# Patient Record
Sex: Male | Born: 1935 | Race: White | Hispanic: No | Smoking: Smoker, current status unknown
Health system: Southern US, Community
[De-identification: ages and names within clinical notes are randomized; demographics above are authoritative.]

## PROBLEM LIST (undated history)

## (undated) DIAGNOSIS — E785 Hyperlipidemia, unspecified: Secondary | ICD-10-CM

## (undated) DIAGNOSIS — I6523 Occlusion and stenosis of bilateral carotid arteries: Secondary | ICD-10-CM

## (undated) DIAGNOSIS — R569 Unspecified convulsions: Secondary | ICD-10-CM

## (undated) DIAGNOSIS — I639 Cerebral infarction, unspecified: Secondary | ICD-10-CM

## (undated) DIAGNOSIS — R1310 Dysphagia, unspecified: Secondary | ICD-10-CM

## (undated) DIAGNOSIS — N390 Urinary tract infection, site not specified: Secondary | ICD-10-CM

## (undated) DIAGNOSIS — I10 Essential (primary) hypertension: Secondary | ICD-10-CM

---

## 2017-11-24 ENCOUNTER — Emergency Department (HOSPITAL_COMMUNITY): Payer: Medicare Other

## 2017-11-24 ENCOUNTER — Encounter (HOSPITAL_COMMUNITY): Payer: Self-pay | Admitting: Emergency Medicine

## 2017-11-24 ENCOUNTER — Inpatient Hospital Stay (HOSPITAL_COMMUNITY)
Admission: EM | Admit: 2017-11-24 | Discharge: 2017-11-28 | DRG: 101 | Disposition: A | Discharge status: Skilled Nursing Facility | Payer: Medicare Other | Attending: Student in an Organized Health Care Education/Training Program | Admitting: Student in an Organized Health Care Education/Training Program

## 2017-11-24 ENCOUNTER — Inpatient Hospital Stay (HOSPITAL_COMMUNITY): Payer: Medicare Other

## 2017-11-24 DIAGNOSIS — R4701 Aphasia: Secondary | ICD-10-CM | POA: Diagnosis present

## 2017-11-24 DIAGNOSIS — G9340 Encephalopathy, unspecified: Secondary | ICD-10-CM | POA: Diagnosis present

## 2017-11-24 DIAGNOSIS — E785 Hyperlipidemia, unspecified: Secondary | ICD-10-CM

## 2017-11-24 DIAGNOSIS — Z79899 Other long term (current) drug therapy: Secondary | ICD-10-CM | POA: Diagnosis not present

## 2017-11-24 DIAGNOSIS — G40109 Localization-related (focal) (partial) symptomatic epilepsy and epileptic syndromes with simple partial seizures, not intractable, without status epilepticus: Secondary | ICD-10-CM | POA: Diagnosis not present

## 2017-11-24 DIAGNOSIS — Z87891 Personal history of nicotine dependence: Secondary | ICD-10-CM | POA: Diagnosis not present

## 2017-11-24 DIAGNOSIS — Z66 Do not resuscitate: Secondary | ICD-10-CM | POA: Diagnosis present

## 2017-11-24 DIAGNOSIS — I10 Essential (primary) hypertension: Secondary | ICD-10-CM | POA: Diagnosis present

## 2017-11-24 DIAGNOSIS — Z7982 Long term (current) use of aspirin: Secondary | ICD-10-CM | POA: Diagnosis not present

## 2017-11-24 DIAGNOSIS — R29727 NIHSS score 27: Secondary | ICD-10-CM | POA: Diagnosis present

## 2017-11-24 DIAGNOSIS — G40909 Epilepsy, unspecified, not intractable, without status epilepticus: Principal | ICD-10-CM | POA: Diagnosis present

## 2017-11-24 DIAGNOSIS — Z8673 Personal history of transient ischemic attack (TIA), and cerebral infarction without residual deficits: Secondary | ICD-10-CM | POA: Diagnosis not present

## 2017-11-24 DIAGNOSIS — R569 Unspecified convulsions: Secondary | ICD-10-CM

## 2017-11-24 DIAGNOSIS — G9349 Other encephalopathy: Secondary | ICD-10-CM | POA: Diagnosis not present

## 2017-11-24 HISTORY — DX: Essential (primary) hypertension: I10

## 2017-11-24 HISTORY — DX: Cerebral infarction, unspecified: I63.9

## 2017-11-24 LAB — CBC
HEMATOCRIT: 39.4 % (ref 39.0–52.0)
HEMOGLOBIN: 13.3 g/dL (ref 13.0–17.0)
MCH: 28.8 pg (ref 26.0–34.0)
MCHC: 33.8 g/dL (ref 30.0–36.0)
MCV: 85.3 fL (ref 78.0–100.0)
Platelets: 150 10*3/uL (ref 150–400)
RBC: 4.62 MIL/uL (ref 4.22–5.81)
RDW: 13.4 % (ref 11.5–15.5)
WBC: 10.5 10*3/uL (ref 4.0–10.5)

## 2017-11-24 LAB — I-STAT CHEM 8, ED
BUN: 18 mg/dL (ref 6–20)
CHLORIDE: 101 mmol/L (ref 101–111)
Calcium, Ion: 1.02 mmol/L — ABNORMAL LOW (ref 1.15–1.40)
Creatinine, Ser: 1.3 mg/dL — ABNORMAL HIGH (ref 0.61–1.24)
GLUCOSE: 149 mg/dL — AB (ref 65–99)
HEMATOCRIT: 41 % (ref 39.0–52.0)
Hemoglobin: 13.9 g/dL (ref 13.0–17.0)
POTASSIUM: 4 mmol/L (ref 3.5–5.1)
Sodium: 138 mmol/L (ref 135–145)
TCO2: 24 mmol/L (ref 22–32)

## 2017-11-24 LAB — COMPREHENSIVE METABOLIC PANEL
ALBUMIN: 4.2 g/dL (ref 3.5–5.0)
ALK PHOS: 86 U/L (ref 38–126)
ALT: 17 U/L (ref 17–63)
ANION GAP: 14 (ref 5–15)
AST: 37 U/L (ref 15–41)
BILIRUBIN TOTAL: 1.2 mg/dL (ref 0.3–1.2)
BUN: 16 mg/dL (ref 6–20)
CALCIUM: 9 mg/dL (ref 8.9–10.3)
CO2: 21 mmol/L — ABNORMAL LOW (ref 22–32)
Chloride: 99 mmol/L — ABNORMAL LOW (ref 101–111)
Creatinine, Ser: 1.44 mg/dL — ABNORMAL HIGH (ref 0.61–1.24)
GFR calc Af Amer: 50 mL/min — ABNORMAL LOW (ref >60)
GFR, EST NON AFRICAN AMERICAN: 43 mL/min — AB (ref >60)
GLUCOSE: 147 mg/dL — AB (ref 65–99)
POTASSIUM: 4 mmol/L (ref 3.5–5.1)
Sodium: 134 mmol/L — ABNORMAL LOW (ref 135–145)
TOTAL PROTEIN: 8.4 g/dL — AB (ref 6.5–8.1)

## 2017-11-24 LAB — RAPID URINE DRUG SCREEN, HOSP PERFORMED
AMPHETAMINES: NOT DETECTED
BARBITURATES: NOT DETECTED
Benzodiazepines: NOT DETECTED
Cocaine: NOT DETECTED
Opiates: NOT DETECTED
TETRAHYDROCANNABINOL: NOT DETECTED

## 2017-11-24 LAB — URINALYSIS, ROUTINE W REFLEX MICROSCOPIC
BILIRUBIN URINE: NEGATIVE
Bacteria, UA: NONE SEEN
GLUCOSE, UA: NEGATIVE mg/dL
Ketones, ur: NEGATIVE mg/dL
LEUKOCYTES UA: NEGATIVE
NITRITE: NEGATIVE
PH: 6 (ref 5.0–8.0)
Protein, ur: >=300 mg/dL — AB
SPECIFIC GRAVITY, URINE: 1.045 — AB (ref 1.005–1.030)
Squamous Epithelial / LPF: NONE SEEN

## 2017-11-24 LAB — LIPID PANEL
Cholesterol: 252 mg/dL — ABNORMAL HIGH (ref 0–200)
HDL: 51 mg/dL (ref >40)
LDL CALC: 183 mg/dL — AB (ref 0–99)
Total CHOL/HDL Ratio: 4.9 RATIO
Triglycerides: 89 mg/dL (ref <150)
VLDL: 18 mg/dL (ref 0–40)

## 2017-11-24 LAB — DIFFERENTIAL
Basophils Absolute: 0 10*3/uL (ref 0.0–0.1)
Basophils Relative: 0 %
EOS ABS: 0 10*3/uL (ref 0.0–0.7)
Eosinophils Relative: 0 %
LYMPHS ABS: 0.5 10*3/uL — AB (ref 0.7–4.0)
LYMPHS PCT: 5 %
MONOS PCT: 8 %
Monocytes Absolute: 0.8 10*3/uL (ref 0.1–1.0)
NEUTROS PCT: 87 %
Neutro Abs: 9.2 10*3/uL — ABNORMAL HIGH (ref 1.7–7.7)

## 2017-11-24 LAB — I-STAT CG4 LACTIC ACID, ED: LACTIC ACID, VENOUS: 2.18 mmol/L — AB (ref 0.5–1.9)

## 2017-11-24 LAB — ETHANOL: Alcohol, Ethyl (B): <10 mg/dL (ref <10)

## 2017-11-24 LAB — I-STAT TROPONIN, ED: TROPONIN I, POC: 0.34 ng/mL — AB (ref 0.00–0.08)

## 2017-11-24 LAB — TROPONIN I: Troponin I: 0.31 ng/mL (ref <0.03)

## 2017-11-24 LAB — PROTIME-INR
INR: 1.28
PROTHROMBIN TIME: 15.9 s — AB (ref 11.4–15.2)

## 2017-11-24 LAB — CBG MONITORING, ED: Glucose-Capillary: 163 mg/dL — ABNORMAL HIGH (ref 65–99)

## 2017-11-24 LAB — APTT: aPTT: 28 seconds (ref 24–36)

## 2017-11-24 MED ORDER — LEVETIRACETAM IN NACL 1500 MG/100ML IV SOLN
1500.0000 mg | Freq: Once | INTRAVENOUS | Status: AC
  Administered 2017-11-24: 1500 mg via INTRAVENOUS
  Filled 2017-11-24: qty 100

## 2017-11-24 MED ORDER — SODIUM CHLORIDE 0.9 % IV SOLN
1000.0000 mg | Freq: Once | INTRAVENOUS | Status: DC
  Filled 2017-11-24: qty 20

## 2017-11-24 MED ORDER — LORAZEPAM 2 MG/ML IJ SOLN
1.0000 mg | INTRAMUSCULAR | Status: DC | PRN
  Administered 2017-11-24: 1 mg via INTRAVENOUS
  Filled 2017-11-24: qty 1

## 2017-11-24 MED ORDER — HEPARIN SODIUM (PORCINE) 5000 UNIT/ML IJ SOLN
5000.0000 [IU] | Freq: Three times a day (TID) | INTRAMUSCULAR | Status: DC
  Administered 2017-11-24 – 2017-11-28 (×13): 5000 [IU] via SUBCUTANEOUS
  Filled 2017-11-24 (×13): qty 1

## 2017-11-24 MED ORDER — SODIUM CHLORIDE 0.9 % IV SOLN
750.0000 mg | Freq: Two times a day (BID) | INTRAVENOUS | Status: DC
  Administered 2017-11-24 – 2017-11-28 (×8): 750 mg via INTRAVENOUS
  Filled 2017-11-24 (×8): qty 7.5

## 2017-11-24 MED ORDER — IOPAMIDOL (ISOVUE-370) INJECTION 76%
90.0000 mL | Freq: Once | INTRAVENOUS | Status: AC | PRN
  Administered 2017-11-24: 90 mL via INTRAVENOUS

## 2017-11-24 MED ORDER — ASPIRIN EC 81 MG PO TBEC
81.0000 mg | DELAYED_RELEASE_TABLET | Freq: Every day | ORAL | Status: DC
  Administered 2017-11-25 – 2017-11-28 (×4): 81 mg via ORAL
  Filled 2017-11-24 (×4): qty 1

## 2017-11-24 NOTE — Procedures (Signed)
EEG Report  Clinical History:  Prior history of seizures.  Presents with right hemiparesis.  MRI Brain negative for acute infarct.    Technical Summary:  A 19 channel digital EEG recording was performed using the 10-20 international system of electrode placement.  Bipolar and Referential montages were used.  The total recording time was approx 20 minutes.  Findings:  There is a posterior dominant rhythm of 8 Hz reactive to eye opening and closure on the right.  Posterior frequencies are in the 6-7 Hz on the left.  There is left frontal and temporal theta frequency slowing throughout the recording.  Sleep is not recorded.  There are no epileptiform discharges or electrographic seizures present.  Impression:  This is an abnormal EEG.  There is evidence of left frontal and temporal dysfunction, which may be due to a post-ictal phenomenon after a seizure.  The patient is not in focal status epilepticus and no current evidence of seizure tendency.    Weston SettleShervin Chaney Ingram, MS, MD

## 2017-11-24 NOTE — Progress Notes (Signed)
Received from ER via stretcher; seizure precautions in place; oriented patient and daughter to room and unit routine; patient is confused but attempting to answer questions; fall safety measures in place; bed alarm is on.

## 2017-11-24 NOTE — Consult Note (Addendum)
NEUROHOSPITALISTS - CONSULTATION NOTE   Requesting Physician: Dr. Doug SouSam Jacubowitz [facility]: Dr. Georgiana SpinnerSushanth Aroor  Admit date: 11/24/2017    Chief Complaint: Aphasia and Right sided weakness - Code Stroke  History obtained from:  Patient's daughter by phone, EMS and Chart  review   HPI   Seth Madden is an 84 y.o. male with a PMH of CVA in 07/2016, HLD and Hx of Seizure not on AED's who is brought into the ED by EMS for Aphasia, Right sided weakness and incontinence noted by family this morning.   History is obtained by phone call with daughter, Seth ReaperSusan Robertson. Patient was found on the couch by son this AM with Right sided weakness and Aphasia. +incontinence. Daughter states this is a similar presentation to 10/2016 when patient was taken to [facility] with suspected CVA but was diagnosed and discharged as Seizure with Keppra BID. Per daughter, at some point the patients PCP discontinued the Keppra and did not think the patient suffered from seizures but had suffered another stroke. Since that time he has not been taking any AED and has had no Neurology follow up.  At baseline the patient lives alone, Independent with ADL's, drives, uses no assistive devices to ambulate Per daughter, no recent injury, illnesses or travel. No N/V/D. No Hx of H/A, No recent weight loss/gain  Date last known well: Date: 11/23/2017 Time last known well: Time: 18:00 tPA Given: No: Outside the window  Modified Rankin: Rankin Score=1 NIH Stroke Scale: 27  Past Medical History   HLD Past Medical History:  Diagnosis Date  . Stroke [facility](HCC)   Seizure - Rx Keppra for Seizures in 10/2016 at [facility], PCP discontinued per daughter Poor short term memory since CVA 07/2016 Past Surgical History  History reviewed. No pertinent surgical history.  Family History  No family history on file.  Social History   has  no tobacco, alcohol, and drug history on file. Lives alone Independent with ADL's at baseline, uses no assistive devices to ambulate Drives, pays bills  Allergies  Not on File  Medications Prior to Admission   No current facility-administered medications on file prior to encounter.    No current outpatient medications on file prior to encounter.   ASA 81 mg daily  ROS  History obtained from unobtainable from patient due to Aphasia Per daughter, no recent injury, illnesses or travel. No N/V/D. No Hx of H/A, No recent weight loss/gain Patient does not appear to be in pain Physical Examination   Vitals:   11/24/17 0941 11/24/17 0945 11/24/17 1000 11/24/17 1015  BP:  (!) 152/81 (!) 143/89 (!) 145/86  Pulse:  84 81 83  Resp:  (!) 24 18 (!) 23  Temp: 97.8 F (36.6 C)     TempSrc: Oral     SpO2:  98% 98% 97%   HEENT-  Normocephalic,  Cardiovascular - Regular rate and rhythm  Respiratory - Non-labored breathing, No wheezing. Abdomen - soft and non-tender, Extremities- no edema or cyanosis Skin-warm and dry  Neurological Examination  Mental Status: Awake and alert, Aphasic, Right sided neglect present. Does not follow any direct commands/mimic. Cranial Nerves: II: Pupils are equal, round, and reactive to light.  +Left gaze preference.  III,IV, VI: Appears unable to cross midline.  +tracking. No ptosis or nystagmus noted.   V: unable to test due to aphasia VII: Facial movement - ? Slight facial droop on Right. ? Baseline finding VIII: hearing appears intact to voice X: unable to  test due to aphasia XI: Shoulder shrug appears weak on right. XII: unable to test due to aphasia Motor: Tone is normal. Bulk is normal.  Does not move any extremities to command. Withdrawals briskly to pain on left. Weak on right. Sensor: Withdrawals briskly to pain on left. Weak on right. Cerebellar: not cooperative for testing due to aphasia Gait: not tested. No obvious truncal  ataxia  Laboratory Results  CBC: Recent Labs  Lab 11/24/17 0850 11/24/17 0855  WBC 10.5  --   HGB 13.3 13.9  HCT 39.4 41.0  MCV 85.3  --   PLT 150  --    Basic Metabolic Panel: Recent Labs  Lab 11/24/17 0850 11/24/17 0855  NA 134* 138  K 4.0 4.0  CL 99* 101  CO2 21*  --   GLUCOSE 147* 149*  BUN 16 18  CREATININE 1.44* 1.30*  CALCIUM 9.0  --    Liver Function Tests: No results for input(s): LIPASE, AMYLASE in the last 168 hours. No results for input(s): AMMONIA in the last 168 hours. Thyroid Function Studies: No results for input(s): TSH, T4TOTAL, T3FREE, THYROIDAB in the last 72 hours. Cardiac Enzymes: No results for input(s): CKTOTAL, CKMB, CKMBINDEX, TROPONINI in the last 168 hours. Coagulation Studies: Recent Labs    11/24/17 0920  APTT 28  INR 1.28   No results for input(s): DDIMER in the last 72 hours. [place] Work -Up No results for input(s): VITAMINB12, FOLATE, IRON, RETICCTPCT in the last 72 hours. Microbiology: @MICRORSLT2 @ Urinalysis: No results for input(s): COLORURINE, APPEARANCEUR, LABSPEC, PHURINE, GLUCOSEU, HGBUR, BILIRUBINUR, KETONESUR, PROTEINUR, UROBILINOGEN, NITRITE, LEUKOCYTESUR in the last 168 hours. Urine Drug Screen:  No results found for: LABOPIA, COCAINSCRNUR, LABBENZ, AMPHETMU, THCU, LABBARB  Alcohol Level:  Recent Labs  Lab 11/24/17 0850  ETH <10    Imaging Results  Ct Angio Head W Or Wo Contrast  Result Date: 11/24/2017 CLINICAL DATA:  Focal neuro deficit.  Stroke suspected. EXAM: CT ANGIOGRAPHY HEAD AND NECK CT PERFUSION BRAIN TECHNIQUE: Multidetector CT imaging of the head and neck was performed using the standard protocol during bolus administration of intravenous contrast. Multiplanar CT image reconstructions and MIPs were obtained to evaluate the vascular anatomy. Carotid stenosis measurements (when applicable) are obtained utilizing NASCET criteria, using the distal internal carotid diameter as the denominator. Multiphase CT  imaging of the brain was performed following IV bolus contrast injection. Subsequent parametric perfusion maps were calculated using RAPID software. CONTRAST:  90mL ISOVUE-370 IOPAMIDOL (ISOVUE-370) INJECTION 76% COMPARISON:  Noncontrast head CT from earlier today FINDINGS: CTA NECK FINDINGS Aortic arch: Atherosclerotic plaque diffusely. Three vessel branching pattern. No acute finding. Right carotid system: Calcified and noncalcified atheromatous wall thickening of the common carotid and proximal ICA. No flow limiting stenosis or ulceration. Left carotid system: Atheromatous wall thickening of the common carotid and proximal ICA. No flow limiting stenosis but there is mild plaque irregularity with small atheromatous ridge at the ICA bulb. Negative for dissection. Vertebral arteries: Mild atheromatous narrowing of the proximal left subclavian artery measuring 40-50%. Right dominant vertebral artery. Vertebral tortuosity without flow limiting stenosis or beading. Skeleton: No acute or aggressive finding. Other neck: No acute or aggressive finding. Upper chest: Mild centrilobular emphysema. Review of the MIP images confirms the above findings CTA HEAD FINDINGS Anterior circulation: Early branching left MCA. Atheromatous plaque on the carotid siphons. No major branch occlusion or flow limiting stenosis. Posterior circulation: Right dominant vertebral artery. Much of the left vertebral flow is into the patent left PICA. The  right AICA is dominant. Vertebral, basilar, and posterior cerebral arteries are smooth and diffusely patent. Venous sinuses: Patent Anatomic variants: None significant Delayed phase: Not obtained in the emergent setting. Possible NPH as noted on preceding head CT. Review of the MIP images confirms the above findings CT Brain Perfusion Findings: CBF (<30%) Volume: 0mL Perfusion (Tmax>6.0s) volume: 0mL These results were communicated to Dr. Laurence Slate at 9:19 amon 3/31/2019by text page via the [facility]  messaging system. IMPRESSION: 1. No emergent large vessel occlusion. No infarct or penumbra by CT perfusion. 2. Cervical atherosclerosis with mild plaque irregularity at the left ICA bulb. 3. 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 4. Mild intracranial atherosclerosis without flow limiting stenosis. 5. Aortic Atherosclerosis (ICD10-I70.0) and Emphysema (ICD10-J43.9). 6. Possible NPH. Electronically Signed   By: Marnee Spring M.D.   On: 11/24/2017 09:22   Ct Angio Neck W Or Wo Contrast  Result Date: 11/24/2017 CLINICAL DATA:  Focal neuro deficit.  Stroke suspected. EXAM: CT ANGIOGRAPHY HEAD AND NECK CT PERFUSION BRAIN TECHNIQUE: Multidetector CT imaging of the head and neck was performed using the standard protocol during bolus administration of intravenous contrast. Multiplanar CT image reconstructions and MIPs were obtained to evaluate the vascular anatomy. Carotid stenosis measurements (when applicable) are obtained utilizing NASCET criteria, using the distal internal carotid diameter as the denominator. Multiphase CT imaging of the brain was performed following IV bolus contrast injection. Subsequent parametric perfusion maps were calculated using RAPID software. CONTRAST:  90mL ISOVUE-370 IOPAMIDOL (ISOVUE-370) INJECTION 76% COMPARISON:  Noncontrast head CT from earlier today FINDINGS: CTA NECK FINDINGS Aortic arch: Atherosclerotic plaque diffusely. Three vessel branching pattern. No acute finding. Right carotid system: Calcified and noncalcified atheromatous wall thickening of the common carotid and proximal ICA. No flow limiting stenosis or ulceration. Left carotid system: Atheromatous wall thickening of the common carotid and proximal ICA. No flow limiting stenosis but there is mild plaque irregularity with small atheromatous ridge at the ICA bulb. Negative for dissection. Vertebral arteries: Mild atheromatous narrowing of the proximal left subclavian artery measuring 40-50%. Right  dominant vertebral artery. Vertebral tortuosity without flow limiting stenosis or beading. Skeleton: No acute or aggressive finding. Other neck: No acute or aggressive finding. Upper chest: Mild centrilobular emphysema. Review of the MIP images confirms the above findings CTA HEAD FINDINGS Anterior circulation: Early branching left MCA. Atheromatous plaque on the carotid siphons. No major branch occlusion or flow limiting stenosis. Posterior circulation: Right dominant vertebral artery. Much of the left vertebral flow is into the patent left PICA. The right AICA is dominant. Vertebral, basilar, and posterior cerebral arteries are smooth and diffusely patent. Venous sinuses: Patent Anatomic variants: None significant Delayed phase: Not obtained in the emergent setting. Possible NPH as noted on preceding head CT. Review of the MIP images confirms the above findings CT Brain Perfusion Findings: CBF (<30%) Volume: 0mL Perfusion (Tmax>6.0s) volume: 0mL These results were communicated to Dr. Laurence Slate at 9:19 amon 3/31/2019by text page via the [facility] messaging system. IMPRESSION: 1. No emergent large vessel occlusion. No infarct or penumbra by CT perfusion. 2. Cervical atherosclerosis with mild plaque irregularity at the left ICA bulb. 3. 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 4. Mild intracranial atherosclerosis without flow limiting stenosis. 5. Aortic Atherosclerosis (ICD10-I70.0) and Emphysema (ICD10-J43.9). 6. Possible NPH. Electronically Signed   By: Marnee Spring M.D.   On: 11/24/2017 09:22   Ct Cerebral Perfusion W Contrast  Result Date: 11/24/2017 CLINICAL DATA:  Focal neuro deficit.  Stroke suspected. EXAM: CT ANGIOGRAPHY HEAD AND NECK  CT PERFUSION BRAIN TECHNIQUE: Multidetector CT imaging of the head and neck was performed using the standard protocol during bolus administration of intravenous contrast. Multiplanar CT image reconstructions and MIPs were obtained to evaluate the vascular anatomy.  Carotid stenosis measurements (when applicable) are obtained utilizing NASCET criteria, using the distal internal carotid diameter as the denominator. Multiphase CT imaging of the brain was performed following IV bolus contrast injection. Subsequent parametric perfusion maps were calculated using RAPID software. CONTRAST:  90mL ISOVUE-370 IOPAMIDOL (ISOVUE-370) INJECTION 76% COMPARISON:  Noncontrast head CT from earlier today FINDINGS: CTA NECK FINDINGS Aortic arch: Atherosclerotic plaque diffusely. Three vessel branching pattern. No acute finding. Right carotid system: Calcified and noncalcified atheromatous wall thickening of the common carotid and proximal ICA. No flow limiting stenosis or ulceration. Left carotid system: Atheromatous wall thickening of the common carotid and proximal ICA. No flow limiting stenosis but there is mild plaque irregularity with small atheromatous ridge at the ICA bulb. Negative for dissection. Vertebral arteries: Mild atheromatous narrowing of the proximal left subclavian artery measuring 40-50%. Right dominant vertebral artery. Vertebral tortuosity without flow limiting stenosis or beading. Skeleton: No acute or aggressive finding. Other neck: No acute or aggressive finding. Upper chest: Mild centrilobular emphysema. Review of the MIP images confirms the above findings CTA HEAD FINDINGS Anterior circulation: Early branching left MCA. Atheromatous plaque on the carotid siphons. No major branch occlusion or flow limiting stenosis. Posterior circulation: Right dominant vertebral artery. Much of the left vertebral flow is into the patent left PICA. The right AICA is dominant. Vertebral, basilar, and posterior cerebral arteries are smooth and diffusely patent. Venous sinuses: Patent Anatomic variants: None significant Delayed phase: Not obtained in the emergent setting. Possible NPH as noted on preceding head CT. Review of the MIP images confirms the above findings CT Brain Perfusion  Findings: CBF (<30%) Volume: 0mL Perfusion (Tmax>6.0s) volume: 0mL These results were communicated to Dr. Laurence Slate at 9:19 amon 3/31/2019by text page via the [facility] messaging system. IMPRESSION: 1. No emergent large vessel occlusion. No infarct or penumbra by CT perfusion. 2. Cervical atherosclerosis with mild plaque irregularity at the left ICA bulb. 3. 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 4. Mild intracranial atherosclerosis without flow limiting stenosis. 5. Aortic Atherosclerosis (ICD10-I70.0) and Emphysema (ICD10-J43.9). 6. Possible NPH. Electronically Signed   By: Marnee Spring M.D.   On: 11/24/2017 09:22   Ct Head Code Stroke Wo Contrast  Result Date: 11/24/2017 CLINICAL DATA:  Code stroke.  Aphasia and right-sided weakness. EXAM: CT HEAD WITHOUT CONTRAST TECHNIQUE: Contiguous axial images were obtained from the base of the skull through the vertex without intravenous contrast. COMPARISON:  None. FINDINGS: Brain: No evidence of acute infarction, hemorrhage, obstructive hydrocephalus, extra-axial collection or mass lesion/mass effect. Ventriculomegaly with variable sulcal diameter (crowding at the vertex) and narrow callosal angle on the coronal reformats. Scattered subarachnoid calcifications attributed to old inflammatory process. No superimposed edema. Atrophy and chronic small vessel ischemia in the cerebral white matter. Vascular: No hyperdense vessel.  Atherosclerotic calcification. Skull: No acute finding. Sinuses/Orbits: Gaze to the left. Other: These results were communicated to Dr. Laurence Slate at 9:03 amon 3/31/2019by text page via the [facility] messaging system. ASPECTS [facility] Stroke Program Early CT Score) - Ganglionic level infarction (caudate, lentiform nuclei, internal capsule, insula, M1-M3 cortex): 7 - Supraganglionic infarction (M4-M6 cortex): 3 Total score (0-10 with 10 being normal): 10 IMPRESSION: 1. No acute finding.ASPECTS is 10 2. Possible NPH. 3. Atrophy and chronic small  vessel ischemia. Electronically Signed   By: Marja Kays  Watts M.D.   On: 11/24/2017 09:05    IMPRESSION AND PLAN  Seth Madden is a 84 y.o. male with PMH of CVA in 07/2016, HLD and Hx of Seizure not on AED's who is brought into the ED by EMS for Aphasia, Right sided weakness and incontinence noted by family this morning. All imaging thus far negative for acute findings   Likely Seizure Activity not on AED's  Outstanding Stroke Work-up Studies:     EEG:                                                                      PENDING  PLAN  11/24/2017  SEIZURES:  Load with Keppra 1.5 Gm IV now Continue Keppra 750 mg BID Switch to PO Keppra once more alert Ativan PRN for seizure activity Maintain Seizure precautions EEG: This is an abnormal EEG.  There is evidence of left frontal and temporal dysfunction, which may be due to a post-ictal phenomenon after a seizure.  The patient is not in focal status epilepticus and no current evidence of seizure tendency.         Frequent Neurochecks  Telemetry Monitoring Continue home dose Aspirin Patient will be counseled to be compliant with his medications Follow up with [facility] in 6 weeks  NEUROLOGICAL  ISSUES  HX OF STROKES: 07/2016 per daughters report Baseline Findings: poor short term memory  Per [employer] statutes, patients with seizures are not allowed to drive until  they have been seizure-free for six months. Use caution when using heavy equipment or power tools. Avoid working on ladders or at heights. Take showers instead of baths. Ensure the water temperature is not too high on the home water heater. Do not go swimming alone. When caring for infants or small children, sit down when holding, feeding, or changing them to minimize risk of injury to the child in the event you have a seizure.   Also, Maintain good sleep hygiene. Avoid alcohol.  --> Call 911 and bring the patient back to the ED if:                    A.  The seizure lasts longer than 5 minutes.                  B.  The patient doesn't awaken shortly after the seizure             C.  The patient has new problems such as difficulty seeing, speaking or moving             D.  The patient was injured during the seizure             E.  The patient has a temperature over 102 F (39C)             F.  The patient vomited and now is having trouble breathing    FAMILY UPDATES: No family at bedside, phone conversation with daughter Seth Madden TEAM UPDATES:Schlossman, Denny Peon, MD    Assessment and plan discussed with with attending physician and they are in agreement.    Beryl Meager, ANP-C [facility] 11/24/2017, 10:56 AM  11/24/2017 ATTENDING ASSESSMENT  I saw and  examined the patient when he arrived as a stroke alert. CTP and CTA were negative. Likely seizure in post ictal state.   I reviewed the history, exam as documented by ARNP and formulated plan as above with some changes.   He is clinically improved. EEG done earlier in the day concerning for frequent slow waves, He clinically worsens would start Dilantin.   Will continue to follow.    Please page stroke NP/ PA / MD from 8am -4 pm   You can look them up on [email or web address]  Password [email or web address]

## 2017-11-24 NOTE — H&P (Addendum)
Date: 11/24/2017               Patient Name:  Seth Madden MRN: [id number]  DOB: 04/28/1931 Age / Sex: 84 y.o., male   PCP: No primary care provider on file.         Medical Service: Internal Medicine Teaching Service         Attending Physician: Dr. Alvira Monday, MD    First Contact: Dr. Lorenso Courier Pager: [phone]  Second Contact: Dr. Eulah Pont Pager: [phone]       After Hours (After 5p/  First Contact Pager: [phone]  weekends / holidays): Second Contact Pager: [phone]   Chief Complaint: Acute encephalopathy and aphasia   History of Present Illness: Seth Madden is a 84 y.o male with hyperlipidemia, hypertension, hx of left posterior circulation stroke, and hx of possible seizure vs stroke in 2017 who presented this morning with aphasia. The patient was last seen normal by his neighbor who spoke to him around 6-6:30pm yesterday 11/23/17. The patient usually closes the front door and shuts his blinds nightly and the neighbor was concerned this morning when he noticed that front door was open. The patient was found slumped off the couch half on the floor and half on the couch, incontinent of urine, and aphasic. The patient has been responding nonverbally to family, but has not been speaking.   At baseline the patient lives alone and is very independent. He drives himself, balances his checks, showers, dresses himself. The patient had two other neurological events previously. He had a questionable stroke vs seizure in Dec 2017 when he was discharged from [facility] with presumed stroke and told to use Keppra. The patient used Keppra for one month and it was discontinued by his pcp as the pcp felt that the patient had a mild stroke rather than a seizure. The patient's daughter mentioned that the patient's presentation reminds her of how he was post previous seizure/stroke episodes.   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  MR brain showed enlargement of ventricle and small vessel disease changes  Meds:  No outpatient medications have been marked as taking for the 11/24/17 encounter [facility] Encounter).   Allergies: Allergies as of 11/24/2017  . (Not on File)   Past Medical History:  Diagnosis Date  . Stroke [facility])    Family History:  Stroke-mother (80's) HTN-mother  Social History:  Lives in [place] by himself  Smoked for 30 yrs, stopped smoking 54yrs Does not drink etoh No drug use   Review of Systems: A complete ROS was negative except as per HPI.   Physical Exam: Blood pressure 131/84, pulse 81, temperature 97.8 F (36.6 C), temperature source Oral, resp. rate 16, SpO2 97 %.  Physical Exam  Constitutional: He appears well-developed and well-nourished. No distress.  HENT:  Head: Normocephalic and atraumatic.  Eyes: Conjunctivae are normal. No scleral icterus.  Cardiovascular: Normal rate, regular rhythm, normal heart sounds and intact distal pulses.  Respiratory: Effort normal and breath sounds normal. No respiratory distress. He has no wheezes.  GI: Soft. Bowel sounds are normal. He exhibits no distension. There is no tenderness.  Musculoskeletal: He exhibits no edema.  Neurological: He is alert.  Neurological exam was difficult to elicit as patient did  not follow commands and was aphasic.  Skin: He is not diaphoretic. No erythema.    EKG: personally reviewed my interpretation is normal sinus rhythm, no st changes or t wave inversions  CXR: not done  Assessment & Plan by Problem:  Seth Madden is a 84 y.o f with htn, hld, prior stroke and possible seizure history who presented with acute encephalopathy and aphasia. Imaging (Ct head and MRI) negative for any acute intracranial  abnormalities.   Acute Encephalopathy and aphasia The patient presents with a one day history of acute encephalopathy and aphasia. The patient was not following commands and cooperating with providers to gauze if he has an weakness or sensation deficits. CT head and MRI brain did not show any acute intracranial abnormalities. Although the patient has had a prior left posterior circulation stroke and he has risk factors of hyperlipidemia and hypertension, the patient's imaging has been negative for an acute stroke.   The patient's encephalopathy, urinary incontinence, and elevated lactic acid (2.18) places more concern for him to have had a seizure and currently be in a post-ictal state.   Other non-neurological concerns were attempted to be ruled out. He has a low etoh level <10 and UDS was negative. His cbg was not low and showed a blood glucose of 163 on admission. He is afebrile and does not have a leukocytosis for him to be having an acute infectious cause of his encephalopathy or aphasia. Urinalysis negative nitiries and leukocytes but postive for protein. No major electrolyte abnormalities seen.   -Ativan 1mg  q2hrs prn -IV Keppra 750mg   -Continue aspirin 81mg   -NPO till stroke swallow completed -EEG ordered which showed left frontal and temporal dysfunction consistent with post-ictal phenomenon. No focal status epilepticus.   Hyperlipidemia Pending lipid panel  Hypertension The patient's blood pressure since admission has ranged 129-152/68-81.  -Allow permissive hypertension, monitor  Dispo: Admit patient to Inpatient with expected length of stay greater than 2 midnights.  SignedLorenso Courier: Iniya Matzek, MD 11/24/2017, 11:41 AM  Pager: [phone]

## 2017-11-24 NOTE — ED Triage Notes (Signed)
Pt arrives via EMS from home as Code Stroke. Pt neighbor and daughter last spoke to/saw pt at 6pm last night. Pt was found this AM on couch, unable to follow commands and incontinent. Pt has hx of CVA with right side defecits, but worsened defecits on exam.

## 2017-11-24 NOTE — Progress Notes (Signed)
EEG Completed; Results Pending  

## 2017-11-24 NOTE — ED Provider Notes (Addendum)
[place] [facility]W PROGRESSIVE CARE Provider Note   CSN: [id number] Arrival date & time: 11/24/17  7829     History   Chief Complaint Chief Complaint  Patient presents with  . Code Stroke    HPI Seth Madden is a 84 y.o. male.  HPI   84yo male with history of CVA presents with concern for altered mental status. Patient presents as a Code Stroke with concern for right sided weakness, aphasia, left gaze deviation.  Was seen last night around 6PM joking around with his neighbor. This morning, neighbor noted his door was open-typically paitent will close and lock door at 8PM per family and it was unusual for it to be wide open so early in the AM>  Neighbor went to check on him and found him half laying on the couch, confused, not acting himself.  They did not know of specific seizure like activity or neuro def.  Patient id unable to answer questions Family reports they do not know of any other recent illness or concern.   Past Medical History:  Diagnosis Date  . Hypertension   . Stroke [facility])     Patient Active Problem List   Diagnosis Date Noted  . Altered mental status 11/24/2017    History reviewed. No pertinent surgical history.      Home Medications    Prior to Admission medications   Medication Sig Start Date End Date Taking? Authorizing Provider  aspirin EC 81 MG tablet Take 81 mg by mouth daily.   Yes [provider]  levETIRAcetam (KEPPRA) 250 MG tablet Take 250 mg by mouth 2 (two) times daily.    [provider]    Family History No family history on file.  Social History Social History   Tobacco Use  . Smoking status: Not on file  Substance Use Topics  . Alcohol use: Not on file  . Drug use: Not on file     Allergies   Patient has no known allergies.   Review of Systems Review of Systems  Unable to perform ROS: Mental status change  Constitutional: Negative for fever.  Respiratory: Negative for shortness of breath.     Cardiovascular: Negative for chest pain.  Gastrointestinal: Negative for abdominal pain.  Neurological: Positive for speech difficulty.     Physical Exam Updated Vital Signs BP (!) 161/91 (BP Location: Right Arm) Comment: map 108  Pulse (!) 107   Temp 99.8 F (37.7 C) (Axillary)   Resp 19   Ht 5\' 8"  (1.727 m)   Wt 58.8 kg (129 lb 10.1 oz)   SpO2 96%   BMI 19.71 kg/m   Physical Exam  Constitutional: He appears well-developed and well-nourished. No distress.  HENT:  Head: Normocephalic and atraumatic.  Eyes: Conjunctivae and EOM are normal.  Neck: Normal range of motion.  Cardiovascular: Normal rate, regular rhythm, normal heart sounds and intact distal pulses. Exam reveals no gallop and no friction rub.  No murmur heard. Pulmonary/Chest: Effort normal and breath sounds normal. No respiratory distress. He has no wheezes. He has no rales.  Abdominal: Soft. He exhibits no distension. There is no tenderness. There is no guarding.  Musculoskeletal: He exhibits no edema.  Neurological:  Left gaze preference Moving arms and legs spontaneously Unable to answer questions, just states "alright" No facial asymmetry   Skin: Skin is warm and dry. He is not diaphoretic.  Nursing note and vitals reviewed.    ED Treatments / Results  Labs (all labs ordered are  listed, but only abnormal results are displayed) Labs Reviewed  DIFFERENTIAL - Abnormal; Notable for the following components:      Result Value   Neutro Abs 9.2 (*)    Lymphs Abs 0.5 (*)    All other components within normal limits  COMPREHENSIVE METABOLIC PANEL - Abnormal; Notable for the following components:   Sodium 134 (*)    Chloride 99 (*)    CO2 21 (*)    Glucose, Bld 147 (*)    Creatinine, Ser 1.44 (*)    Total Protein 8.4 (*)    GFR calc non Af Amer 43 (*)    GFR calc Af Amer 50 (*)    All other components within normal limits  URINALYSIS, ROUTINE W REFLEX MICROSCOPIC - Abnormal; Notable for the following  components:   Specific Gravity, Urine 1.045 (*)    Hgb urine dipstick LARGE (*)    Protein, ur >=300 (*)    All other components within normal limits  PROTIME-INR - Abnormal; Notable for the following components:   Prothrombin Time 15.9 (*)    All other components within normal limits  LIPID PANEL - Abnormal; Notable for the following components:   Cholesterol 252 (*)    LDL Cholesterol 183 (*)    All other components within normal limits  CBG MONITORING, ED - Abnormal; Notable for the following components:   Glucose-Capillary 163 (*)    All other components within normal limits  I-STAT CHEM 8, ED - Abnormal; Notable for the following components:   Creatinine, Ser 1.30 (*)    Glucose, Bld 149 (*)    Calcium, Ion 1.02 (*)    All other components within normal limits  I-STAT TROPONIN, ED - Abnormal; Notable for the following components:   Troponin i, poc 0.34 (*)    All other components within normal limits  I-STAT CG4 LACTIC ACID, ED - Abnormal; Notable for the following components:   Lactic Acid, Venous 2.18 (*)    All other components within normal limits  ETHANOL  CBC  RAPID URINE DRUG SCREEN, HOSP PERFORMED  APTT  BASIC METABOLIC PANEL  TROPONIN I    EKG EKG Interpretation  Date/Time:  Sunday November 24 2017 09:31:09 EDT Ventricular Rate:  84 PR Interval:    QRS Duration: 102 QT Interval:  384 QTC Calculation: 454 R Axis:   88 Text Interpretation:  Sinus rhythm Borderline right axis deviation Nonspecific T abnrm, anterolateral leads Baseline wander in lead(s) V1 No previous ECGs available Confirmed by Alvira Monday ([id number]) on 11/24/2017 9:19:56 PM   Radiology Ct Angio Head W Or Wo Contrast  Result Date: 11/24/2017 CLINICAL DATA:  Focal neuro deficit.  Stroke suspected. EXAM: CT ANGIOGRAPHY HEAD AND NECK CT PERFUSION BRAIN TECHNIQUE: Multidetector CT imaging of the head and neck was performed using the standard protocol during bolus administration of intravenous  contrast. Multiplanar CT image reconstructions and MIPs were obtained to evaluate the vascular anatomy. Carotid stenosis measurements (when applicable) are obtained utilizing NASCET criteria, using the distal internal carotid diameter as the denominator. Multiphase CT imaging of the brain was performed following IV bolus contrast injection. Subsequent parametric perfusion maps were calculated using RAPID software. CONTRAST:  90mL ISOVUE-370 IOPAMIDOL (ISOVUE-370) INJECTION 76% COMPARISON:  Noncontrast head CT from earlier today FINDINGS: CTA NECK FINDINGS Aortic arch: Atherosclerotic plaque diffusely. Three vessel branching pattern. No acute finding. Right carotid system: Calcified and noncalcified atheromatous wall thickening of the common carotid and proximal ICA. No flow limiting stenosis or ulceration. Left  carotid system: Atheromatous wall thickening of the common carotid and proximal ICA. No flow limiting stenosis but there is mild plaque irregularity with small atheromatous ridge at the ICA bulb. Negative for dissection. Vertebral arteries: Mild atheromatous narrowing of the proximal left subclavian artery measuring 40-50%. Right dominant vertebral artery. Vertebral tortuosity without flow limiting stenosis or beading. Skeleton: No acute or aggressive finding. Other neck: No acute or aggressive finding. Upper chest: Mild centrilobular emphysema. Review of the MIP images confirms the above findings CTA HEAD FINDINGS Anterior circulation: Early branching left MCA. Atheromatous plaque on the carotid siphons. No major branch occlusion or flow limiting stenosis. Posterior circulation: Right dominant vertebral artery. Much of the left vertebral flow is into the patent left PICA. The right AICA is dominant. Vertebral, basilar, and posterior cerebral arteries are smooth and diffusely patent. Venous sinuses: Patent Anatomic variants: None significant Delayed phase: Not obtained in the emergent setting. Possible NPH as  noted on preceding head CT. Review of the MIP images confirms the above findings CT Brain Perfusion Findings: CBF (<30%) Volume: 0mL Perfusion (Tmax>6.0s) volume: 0mL These results were communicated to Dr. Laurence Slate at 9:19 amon 3/31/2019by text page via the [facility] messaging system. IMPRESSION: 1. No emergent large vessel occlusion. No infarct or penumbra by CT perfusion. 2. Cervical atherosclerosis with mild plaque irregularity at the left ICA bulb. 3. 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 4. Mild intracranial atherosclerosis without flow limiting stenosis. 5. Aortic Atherosclerosis (ICD10-I70.0) and Emphysema (ICD10-J43.9). 6. Possible NPH. Electronically Signed   By: Marnee Spring M.D.   On: 11/24/2017 09:22   Ct Angio Neck W Or Wo Contrast  Result Date: 11/24/2017 CLINICAL DATA:  Focal neuro deficit.  Stroke suspected. EXAM: CT ANGIOGRAPHY HEAD AND NECK CT PERFUSION BRAIN TECHNIQUE: Multidetector CT imaging of the head and neck was performed using the standard protocol during bolus administration of intravenous contrast. Multiplanar CT image reconstructions and MIPs were obtained to evaluate the vascular anatomy. Carotid stenosis measurements (when applicable) are obtained utilizing NASCET criteria, using the distal internal carotid diameter as the denominator. Multiphase CT imaging of the brain was performed following IV bolus contrast injection. Subsequent parametric perfusion maps were calculated using RAPID software. CONTRAST:  90mL ISOVUE-370 IOPAMIDOL (ISOVUE-370) INJECTION 76% COMPARISON:  Noncontrast head CT from earlier today FINDINGS: CTA NECK FINDINGS Aortic arch: Atherosclerotic plaque diffusely. Three vessel branching pattern. No acute finding. Right carotid system: Calcified and noncalcified atheromatous wall thickening of the common carotid and proximal ICA. No flow limiting stenosis or ulceration. Left carotid system: Atheromatous wall thickening of the common carotid and  proximal ICA. No flow limiting stenosis but there is mild plaque irregularity with small atheromatous ridge at the ICA bulb. Negative for dissection. Vertebral arteries: Mild atheromatous narrowing of the proximal left subclavian artery measuring 40-50%. Right dominant vertebral artery. Vertebral tortuosity without flow limiting stenosis or beading. Skeleton: No acute or aggressive finding. Other neck: No acute or aggressive finding. Upper chest: Mild centrilobular emphysema. Review of the MIP images confirms the above findings CTA HEAD FINDINGS Anterior circulation: Early branching left MCA. Atheromatous plaque on the carotid siphons. No major branch occlusion or flow limiting stenosis. Posterior circulation: Right dominant vertebral artery. Much of the left vertebral flow is into the patent left PICA. The right AICA is dominant. Vertebral, basilar, and posterior cerebral arteries are smooth and diffusely patent. Venous sinuses: Patent Anatomic variants: None significant Delayed phase: Not obtained in the emergent setting. Possible NPH as noted on preceding head CT. Review of the  MIP images confirms the above findings CT Brain Perfusion Findings: CBF (<30%) Volume: 0mL Perfusion (Tmax>6.0s) volume: 0mL These results were communicated to Dr. Laurence SlateAroor at 9:19 amon 3/31/2019by text page via the [facility] messaging system. IMPRESSION: 1. No emergent large vessel occlusion. No infarct or penumbra by CT perfusion. 2. Cervical atherosclerosis with mild plaque irregularity at the left ICA bulb. 3. 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 4. Mild intracranial atherosclerosis without flow limiting stenosis. 5. Aortic Atherosclerosis (ICD10-I70.0) and Emphysema (ICD10-J43.9). 6. Possible NPH. Electronically Signed   By: Marnee SpringJonathon  Watts M.D.   On: 11/24/2017 09:22   Mr Brain Wo Contrast  Result Date: 11/24/2017 CLINICAL DATA:  Aphasia and right-sided weakness. EXAM: MRI HEAD WITHOUT CONTRAST TECHNIQUE: Multiplanar,  multiecho pulse sequences of the brain and surrounding structures were obtained without intravenous contrast. COMPARISON:  Head CT and CTA from earlier the same day FINDINGS: Brain: No acute infarction, hemorrhage, obstructive hydrocephalus, extra-axial collection or mass lesion. Generalized ventriculomegaly with narrow callosal angle on coronal acquisition, up lifting of the corpus callosum, and sulcal narrowing exclusively at the vertex. Periventricular FLAIR hyperintensity, likely chronic microvascular ischemia. Atrophy. Vascular: Major flow voids are preserved. Status post CTA earlier the same day. Skull and upper cervical spine: No evidence of marrow lesion. Sinuses/Orbits: Persistent gaze to the left. Other: Intermittent advanced motion degradation which could easily obscure findings. IMPRESSION: 1. Motion degraded study without acute finding. No visible infarct or discrete seizure focus. 2. 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 3. Moderate white matter disease, usually chronic small vessel ischemia. Electronically Signed   By: Marnee SpringJonathon  Watts M.D.   On: 11/24/2017 12:14   Ct Cerebral Perfusion W Contrast  Result Date: 11/24/2017 CLINICAL DATA:  Focal neuro deficit.  Stroke suspected. EXAM: CT ANGIOGRAPHY HEAD AND NECK CT PERFUSION BRAIN TECHNIQUE: Multidetector CT imaging of the head and neck was performed using the standard protocol during bolus administration of intravenous contrast. Multiplanar CT image reconstructions and MIPs were obtained to evaluate the vascular anatomy. Carotid stenosis measurements (when applicable) are obtained utilizing NASCET criteria, using the distal internal carotid diameter as the denominator. Multiphase CT imaging of the brain was performed following IV bolus contrast injection. Subsequent parametric perfusion maps were calculated using RAPID software. CONTRAST:  90mL ISOVUE-370 IOPAMIDOL (ISOVUE-370) INJECTION 76% COMPARISON:  Noncontrast head  CT from earlier today FINDINGS: CTA NECK FINDINGS Aortic arch: Atherosclerotic plaque diffusely. Three vessel branching pattern. No acute finding. Right carotid system: Calcified and noncalcified atheromatous wall thickening of the common carotid and proximal ICA. No flow limiting stenosis or ulceration. Left carotid system: Atheromatous wall thickening of the common carotid and proximal ICA. No flow limiting stenosis but there is mild plaque irregularity with small atheromatous ridge at the ICA bulb. Negative for dissection. Vertebral arteries: Mild atheromatous narrowing of the proximal left subclavian artery measuring 40-50%. Right dominant vertebral artery. Vertebral tortuosity without flow limiting stenosis or beading. Skeleton: No acute or aggressive finding. Other neck: No acute or aggressive finding. Upper chest: Mild centrilobular emphysema. Review of the MIP images confirms the above findings CTA HEAD FINDINGS Anterior circulation: Early branching left MCA. Atheromatous plaque on the carotid siphons. No major branch occlusion or flow limiting stenosis. Posterior circulation: Right dominant vertebral artery. Much of the left vertebral flow is into the patent left PICA. The right AICA is dominant. Vertebral, basilar, and posterior cerebral arteries are smooth and diffusely patent. Venous sinuses: Patent Anatomic variants: None significant Delayed phase: Not obtained in the emergent setting. Possible NPH as  noted on preceding head CT. Review of the MIP images confirms the above findings CT Brain Perfusion Findings: CBF (<30%) Volume: 0mL Perfusion (Tmax>6.0s) volume: 0mL These results were communicated to Dr. Laurence Slate at 9:19 amon 3/31/2019by text page via the [facility] messaging system. IMPRESSION: 1. No emergent large vessel occlusion. No infarct or penumbra by CT perfusion. 2. Cervical atherosclerosis with mild plaque irregularity at the left ICA bulb. 3. 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 4.  Mild intracranial atherosclerosis without flow limiting stenosis. 5. Aortic Atherosclerosis (ICD10-I70.0) and Emphysema (ICD10-J43.9). 6. Possible NPH. Electronically Signed   By: Marnee Spring M.D.   On: 11/24/2017 09:22   Ct Head Code Stroke Wo Contrast  Result Date: 11/24/2017 CLINICAL DATA:  Code stroke.  Aphasia and right-sided weakness. EXAM: CT HEAD WITHOUT CONTRAST TECHNIQUE: Contiguous axial images were obtained from the base of the skull through the vertex without intravenous contrast. COMPARISON:  None. FINDINGS: Brain: No evidence of acute infarction, hemorrhage, obstructive hydrocephalus, extra-axial collection or mass lesion/mass effect. Ventriculomegaly with variable sulcal diameter (crowding at the vertex) and narrow callosal angle on the coronal reformats. Scattered subarachnoid calcifications attributed to old inflammatory process. No superimposed edema. Atrophy and chronic small vessel ischemia in the cerebral white matter. Vascular: No hyperdense vessel.  Atherosclerotic calcification. Skull: No acute finding. Sinuses/Orbits: Gaze to the left. Other: These results were communicated to Dr. Laurence Slate at 9:03 amon 3/31/2019by text page via the [facility] messaging system. ASPECTS [facility] Stroke Program Early CT Score) - Ganglionic level infarction (caudate, lentiform nuclei, internal capsule, insula, M1-M3 cortex): 7 - Supraganglionic infarction (M4-M6 cortex): 3 Total score (0-10 with 10 being normal): 10 IMPRESSION: 1. No acute finding.ASPECTS is 10 2. Possible NPH. 3. Atrophy and chronic small vessel ischemia. Electronically Signed   By: Marnee Spring M.D.   On: 11/24/2017 09:05    Procedures .Critical Care Performed by: Alvira Monday, MD Authorized by: Alvira Monday, MD   Critical care provider statement:    Critical care time (minutes):  30   Critical care was necessary to treat or prevent imminent or life-threatening deterioration of the following conditions:  CNS failure or  compromise   Critical care was time spent personally by me on the following activities:  Discussions with consultants, examination of patient, review of old charts, re-evaluation of patient's condition, ordering and review of laboratory studies and ordering and review of radiographic studies   (including critical care time)  Medications Ordered in ED Medications  levETIRAcetam (KEPPRA) 750 mg in sodium chloride 0.9 % 100 mL IVPB (750 mg Intravenous New Bag/Given 11/24/17 2108)  LORazepam (ATIVAN) injection 1 mg (1 mg Intravenous Given 11/24/17 2056)  aspirin EC tablet 81 mg (81 mg Oral Not Given 11/24/17 1345)  heparin injection 5,000 Units (5,000 Units Subcutaneous Given 11/24/17 2107)  phenytoin (DILANTIN) 1,000 mg in sodium chloride 0.9 % 250 mL IVPB (has no administration in time range)  iopamidol (ISOVUE-370) 76 % injection 90 mL (90 mLs Intravenous Contrast Given 11/24/17 0903)  levETIRAcetam (KEPPRA) IVPB 1500 mg/ 100 mL premix (0 mg Intravenous Stopped 11/24/17 1115)     Initial Impression / Assessment and Plan / ED Course  I have reviewed the triage vital signs and the nursing notes.  Pertinent labs & imaging results that were available during my care of the patient were reviewed by me and considered in my medical decision making (see chart for details).     84yo male with history of CVA presents with concern for altered mental status. Patient  presents as a Code Stroke with concern for right sided weakness, aphasia, left gaze deviation with last known normal yesterday. Previous hx of stroke and also possible hx of seizure at [facility].  CT and CTA negative. Neurology has concern for more likely seizure with post-ictal state than CVA. Will obtain inpatient EEG and MRI.  Labs significant for troponin of .3, likely in the setting of acute illness and stress. No sign of infection at this time or other sig electrolyte abnormalities. Given keppra and will continue to monitor, admit for continued  care.   Final Clinical Impressions(s) / ED Diagnoses   Final diagnoses:  Seizures [facility])  Aphasia    ED Discharge Orders    None       Alvira Monday, MD 11/24/17 2133    Alvira Monday, MD 12/10/17 1058

## 2017-11-24 NOTE — ED Provider Notes (Signed)
MSE was initiated and I personally evaluated the patient and placed orders (if any) at  9:02 AM on November 24, 2017.  The patient appears stable so that the remainder of the MSE may be completed by another provider.  Seen on arrival last normal 6 PM yesterday.  Patient arrives with leftward gaze, ignoring right side.  Code stroke was called in the field.  Patient is managing his airway well.  Stable for transfer to CT scan   Doug SouJacubowitz, Alleah Dearman, MD 11/24/17 [id number]

## 2017-11-24 NOTE — ED Notes (Signed)
Patient transported to MRI 

## 2017-11-25 DIAGNOSIS — E785 Hyperlipidemia, unspecified: Secondary | ICD-10-CM

## 2017-11-25 DIAGNOSIS — G40109 Localization-related (focal) (partial) symptomatic epilepsy and epileptic syndromes with simple partial seizures, not intractable, without status epilepticus: Secondary | ICD-10-CM

## 2017-11-25 DIAGNOSIS — I10 Essential (primary) hypertension: Secondary | ICD-10-CM

## 2017-11-25 DIAGNOSIS — Z79899 Other long term (current) drug therapy: Secondary | ICD-10-CM

## 2017-11-25 DIAGNOSIS — G9349 Other encephalopathy: Secondary | ICD-10-CM

## 2017-11-25 DIAGNOSIS — Z8673 Personal history of transient ischemic attack (TIA), and cerebral infarction without residual deficits: Secondary | ICD-10-CM

## 2017-11-25 LAB — BASIC METABOLIC PANEL
ANION GAP: 11 (ref 5–15)
BUN: 23 mg/dL — ABNORMAL HIGH (ref 6–20)
CO2: 24 mmol/L (ref 22–32)
Calcium: 8.6 mg/dL — ABNORMAL LOW (ref 8.9–10.3)
Chloride: 103 mmol/L (ref 101–111)
Creatinine, Ser: 1.41 mg/dL — ABNORMAL HIGH (ref 0.61–1.24)
GFR, EST AFRICAN AMERICAN: 51 mL/min — AB (ref >60)
GFR, EST NON AFRICAN AMERICAN: 44 mL/min — AB (ref >60)
Glucose, Bld: 96 mg/dL (ref 65–99)
POTASSIUM: 3.7 mmol/L (ref 3.5–5.1)
SODIUM: 138 mmol/L (ref 135–145)

## 2017-11-25 LAB — CK: CK TOTAL: 552 U/L — AB (ref 49–397)

## 2017-11-25 MED ORDER — SODIUM CHLORIDE 0.9 % IV SOLN
INTRAVENOUS | Status: DC
  Administered 2017-11-25 – 2017-11-27 (×5): via INTRAVENOUS

## 2017-11-25 MED ORDER — LACOSAMIDE 200 MG/20ML IV SOLN
100.0000 mg | Freq: Two times a day (BID) | INTRAVENOUS | Status: DC
  Administered 2017-11-25 – 2017-11-28 (×7): 100 mg via INTRAVENOUS
  Filled 2017-11-25 (×8): qty 10

## 2017-11-25 NOTE — Evaluation (Signed)
Clinical/Bedside Swallow Evaluation Patient Details  Name: Seth Madden MRN: [id number] Date of Birth: 05-17-1931  Today's Date: 11/25/2017 Time: SLP Start Time (ACUTE ONLY): [id number] SLP Stop Time (ACUTE ONLY): 0856 SLP Time Calculation (min) (ACUTE ONLY): 19 min  Past Medical History:  Past Medical History:  Diagnosis Date  . Hypertension   . Stroke [facility](HCC)    Past Surgical History: History reviewed. No pertinent surgical history. 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 No CXR.   Assessment / Plan / Recommendation Clinical Impression  Pt exhibits a mild cognitive based oral dysphagia marked by labial residue and leakage. Delayed responsedto commands. No s/s aspiration during 3 oz water text; swallow is audible, sometimes indicating discoordination of swallow. Mastication of solid texture functional. Pt needs set up and assist to intiate feeding. Recommend regular texture, thin liquids, pills with water, assist to initiate feeds. Will follow for safety with recommendations.   SLP Visit Diagnosis: Dysphagia, oral phase (R13.11)    Aspiration Risk  Mild aspiration risk    Diet Recommendation Regular;Thin liquid   Liquid Administration via: Cup;Straw Medication Administration: Whole meds with liquid Supervision: Staff to assist with self feeding;Patient able to self feed;Full supervision/cueing for compensatory strategies Compensations: Minimize environmental distractions;Slow rate;Small sips/bites Postural Changes: Seated upright at 90 degrees    Other  Recommendations Oral Care Recommendations: Oral care  BID   Follow up Recommendations None      Frequency and Duration min 1 x/week  1 week       Prognosis Prognosis for Safe Diet Advancement: Good      Swallow Study   General HPI: 84 y.o male with hyperlipidemia,hypertension,hx ofleft posterior circulation stroke, and hx of possible seizure vs strokein 2017who presented this morning with aphasia.CT head without any acute intracranial abnormalities, CT angio head and neck without any large vessel occlusion or infarct, 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 No CXR. Type of Study: Bedside Swallow Evaluation Previous Swallow Assessment: (none) Diet Prior to this Study: NPO Temperature Spikes Noted: No Respiratory Status: Room air History of Recent Intubation: No Behavior/Cognition: Alert;Requires cueing;Cooperative Oral Cavity Assessment: Dry Oral Care Completed by SLP: No Oral Cavity - Dentition: Dentures, top;Dentures, bottom Vision: Functional for self-feeding Self-Feeding Abilities: Able to feed self;Needs set up;Needs assist Patient Positioning: Upright in bed Baseline Vocal Quality: Normal    Oral/Motor/Sensory Function Overall Oral Motor/Sensory Function: Mild impairment Facial ROM: Reduced right   Ice Chips Ice chips: Not tested   Thin Liquid Thin Liquid: Impaired Presentation: Cup;Straw Oral Phase Impairments: Reduced lingual movement/coordination;Reduced labial seal Oral Phase Functional Implications: Oral holding;Right anterior spillage Pharyngeal  Phase Impairments: (audible swallow)    Nectar Thick Nectar Thick Liquid: Not tested   Honey Thick Honey Thick Liquid: Not tested   Puree Puree: Impaired Presentation: Spoon;Self Fed Oral Phase Impairments: Reduced labial seal;Reduced lingual movement/coordination Oral Phase Functional Implications: Right anterior spillage Pharyngeal Phase Impairments: (none)   Solid   GO   Solid:  Within functional limits        [name], Emanuella Nickle Willis 11/25/2017,9:21 AM  Breck CoonsLisa Willis Lonell FaceLitaker M.Ed [employer]SLP Pager [phone]

## 2017-11-25 NOTE — Progress Notes (Signed)
Internal Medicine Attending:   I saw and examined the patient. I reviewed the resident's note and I agree with the resident's findings and plan as documented in the resident's note.  Acute encephalopathy due to postictal state and seizure disorder.  Encephalopathy is somewhat improved today, but still not at his baseline.  He was still having partial seizure activity with right foot rhythmic twitching.  I agree with neurology, continue with Keppra and add Vimpat today.  We will follow-up on his mental status, hopefully will improve as we reduce the seizure burden.

## 2017-11-25 NOTE — Progress Notes (Signed)
   Subjective: The patient was noted to be in bed eating breakfast upon entering the room.  Although he states that he feels significantly improved and is ready to be discharged home is again unable to recall his daughter name at bedside, recent event, date, location, or anything other than his general name.  He continues to deny pain, nausea, abdominal discomfort or other concerning symptoms.  Objective:  Vital signs in last 24 hours: Vitals:   11/25/17 0010 11/25/17 0422 11/25/17 0747 11/25/17 1204  BP: 137/78 130/75 (!) 146/76 (!) 144/73  Pulse: 73 79 79 76  Resp: 18 18 18 18   Temp: 97.6 F (36.4 C) (!) 97.5 F (36.4 C) 98.3 F (36.8 C) 98.5 F (36.9 C)  TempSrc: Oral Oral Oral Oral  SpO2: 97% 96% 96% 96%  Weight:      Height:       ROS negative except as per HPI  Physical Exam: General: No acute distress, alert, and oriented, but minimally conversant Neuro: There appears to be mild rhythmic motion of the right distal lower extremity concerning for focal seizure activity HEENT: Deficits observed with regard to extraocular movement pupillary light response Cardiovascular: RRR, no murmur auscultated on exam Pulmonary: There are no prominent wheezing, crackles, with bilateral lung sounds clear to auscultation GI: Abdomen is soft, nontender, nondistended Exhibits no edema or tenderness of distal extremities MUSC: Neurological: He is alert but not oriented to anything other than his first name.  He intermittently follows commands  Assessment/Plan:  Active Problems:   Recurrent seizures (HCC)   Essential hypertension   Hyperlipidemia  Assessment:  Seth Madden is a 84 year old male with a PMHx notable for HLD, HTN, posterior circ stroke, possible seizure versus stroke in 2017 who presented with acute encephalopathy after being found down at his home. He was noted to be aphasic and incontinent of urine at baseline he is very dependent upon his own ADLs today during initial  evaluation by EMS.  Recent possible stroke procedures of 2017 was able to use Keppra.  This was discontinued 1 month later as a similar felt with the event be related to seizure activity, likely with a stroke.  Patient improved minimally on day 2 but continued to demonstrate possible focal seizure activity in his right distal lower extremity with EEG indicating possible left temporal dysfunction.  After the MRI failed to demonstrate an acute intracranial process he was placed on Keppra and Vimpat and observed for improvement.  Plan: Recurrent seizure: Acute encephalopathy: Neurology feels patient's physical exam is consistent with a possible focal seizure that is resolved.  EEG demonstrated left temporal dysfunction with some intermixed periodic activity which is suggestive of seizure focus.  This is thought to be likely related to seizure-like activity as likely stroke. --Keppra 700 mg twice daily  --They will add lacosamide 100 mg.  Hyperlipidemia:  Total cholesterol 252, triglycerides 89, HDL 51, LDL 183. Given the patients age and risk factors a low potency statin could be considered. We will discuss this with the patient when he is more coherent.   Hypertension: Patient's pressure continued to remain mildly hypertensive. Currently allowing minimal permissive hypertension for the possibility of acute cerebrovascular infarct  Dispo: Anticipated discharge in approximately 1-2 day(s).   Seth Madden, Seth Marsan, MD 11/25/2017, 3:12 PM Pager: Pager# [phone]

## 2017-11-25 NOTE — Plan of Care (Signed)
  Problem: Clinical Measurements: Goal: Will remain free from infection Outcome: Progressing   Problem: Coping: Goal: Level of anxiety will decrease Outcome: Progressing   Problem: Health Behavior/Discharge Planning: Goal: Ability to manage health-related needs will improve Outcome: Progressing   Problem: Coping: Goal: Level of anxiety will decrease Outcome: Progressing

## 2017-11-25 NOTE — Progress Notes (Signed)
Subjective: Significantly improved, possibly still mildly confused.   Exam: Vitals:   11/25/17 0422 11/25/17 0747  BP: 130/75 (!) 146/76  Pulse: 79 79  Resp: 18 18  Temp: (!) 97.5 F (36.4 C) 98.3 F (36.8 C)  SpO2: 96% 96%   Gen: In bed, NAD Resp: non-labored breathing, no acute distress Abd: soft, nt  Neuro: MS: Awake, oriented to hospital, gives mont as January.  CN:VFF, face symmetric, palate elvates symmetrically.  Motor: 5/5 throughout Sensory:intact to LT  Pertinent Labs: Cr 1.3  Impression: 84 yo M with what I suspect was a focal seizure which appears to be resolved. EEG with left temporal dysfunction, I feel there is some intermixed periodic activity, suggestive seizure focus.   Recommendations: 1) continue keppra 750mg  BID 2) Driving prohibition x 6 months.  3) will follow.   Ritta SlotMcNeill Greenleigh Kauth, MD [facility] [phone]  If 7pm- 7am, please page neurology on call as listed in AMION.

## 2017-11-26 DIAGNOSIS — R4701 Aphasia: Secondary | ICD-10-CM

## 2017-11-26 LAB — BASIC METABOLIC PANEL
Anion gap: 11 (ref 5–15)
BUN: 20 mg/dL (ref 6–20)
CO2: 24 mmol/L (ref 22–32)
CREATININE: 1.22 mg/dL (ref 0.61–1.24)
Calcium: 7.9 mg/dL — ABNORMAL LOW (ref 8.9–10.3)
Chloride: 105 mmol/L (ref 101–111)
GFR calc Af Amer: >60 mL/min (ref >60)
GFR, EST NON AFRICAN AMERICAN: 53 mL/min — AB (ref >60)
Glucose, Bld: 92 mg/dL (ref 65–99)
POTASSIUM: 3.5 mmol/L (ref 3.5–5.1)
SODIUM: 140 mmol/L (ref 135–145)

## 2017-11-26 MED ORDER — AMLODIPINE BESYLATE 5 MG PO TABS
5.0000 mg | ORAL_TABLET | Freq: Every day | ORAL | Status: DC
  Administered 2017-11-26 – 2017-11-28 (×3): 5 mg via ORAL
  Filled 2017-11-26 (×3): qty 1

## 2017-11-26 MED ORDER — POTASSIUM CHLORIDE CRYS ER 20 MEQ PO TBCR
30.0000 meq | EXTENDED_RELEASE_TABLET | Freq: Once | ORAL | Status: AC
  Administered 2017-11-26: 17:00:00 30 meq via ORAL
  Filled 2017-11-26: qty 1

## 2017-11-26 NOTE — Evaluation (Signed)
Physical Therapy Evaluation Patient Details Name: Seth Madden MRN: [id number] DOB: [birth year] Today's Date: 11/26/2017   History of Present Illness  Pt. is a 84 y.o. M with significant PMH of hyperlipidemia, hypertension, history of left posterior stroke, who presented this morning with aphasia and acute encephalopathy in a post-ictal state. CT and MRI negative for acute abnormalities.    Clinical Impression  Patient with decreased functional mobility compared to baseline secondary to impulsivity with decreased safety awareness, diminished balance, functional strength, and cognitive deficits. Performing transfers with supervision and no device. Unable to initiate gait at this point due to patient impulsivity and agitation with desiring to stand pivot from bed to chair before set up. Patient lives alone at baseline and does currently presenting as a fall risk. Recommending SNF at this time unless patient progresses well and has 24/7 supervision/assist from family member.     Follow Up Recommendations Supervision/Assistance - 24 hour;SNF    Equipment Recommendations  Other (comment)(TBD)    Recommendations for Other Services       Precautions / Restrictions Precautions Precautions: Fall;Other (comment)(Seizure) Restrictions Weight Bearing Restrictions: No      Mobility  Bed Mobility Overal bed mobility: Modified Independent             General bed mobility comments: Patient modified independent with supine to sit with increased time and effort  Transfers Overall transfer level: Needs assistance Equipment used: None Transfers: Sit to/from Stand;Stand Pivot Transfers Sit to Stand: Supervision Stand pivot transfers: Supervision       General transfer comment: Patient performing stand pivot from bed to recliner impulsively before completely set up with increased trunk flexion due to reaching for arm rest. able to sit to stand from recliner with supervision in order to  adjust gown.    Ambulation/Gait             General Gait Details: Not attempted at this time due to patient impulsivity.  Stairs            Wheelchair Mobility    Modified Rankin (Stroke Patients Only) Modified Rankin (Stroke Patients Only) Pre-Morbid Rankin Score: No significant disability Modified Rankin: Moderately severe disability     Balance Overall balance assessment: Needs assistance Sitting-balance support: No upper extremity supported;Feet supported Sitting balance-Leahy Scale: Good     Standing balance support: Single extremity supported Standing balance-Leahy Scale: Fair Standing balance comment: Supervision for safety. Reaching for armrest to steady                             Pertinent Vitals/Pain Pain Assessment: Faces Faces Pain Scale: No hurt    Home Living Family/patient expects to be discharged to:: Unsure Living Arrangements: Alone Available Help at Discharge: Other (Comment)(Unsure) Type of Home: House         Home Equipment: None Additional Comments: Unable to obtain accurate home living situation. Patient states he lives with his wife when he lives alone.      Prior Function Level of Independence: Independent         Comments: From MD note: "at baseline patient lives alone and is very independent. He drives himself, balances his checks, showers, dresses himself."     Hand Dominance        Extremity/Trunk Assessment   Upper Extremity Assessment Upper Extremity Assessment: Defer to OT evaluation    Lower Extremity Assessment Lower Extremity Assessment: Overall WFL for tasks assessed;Difficult to assess due to impaired cognition(Patient  able to perform heel slides in bed)       Communication   Communication: No difficulties  Cognition Arousal/Alertness: Awake/alert Behavior During Therapy: Impulsive Overall Cognitive Status: Impaired/Different from baseline Area of Impairment:  Orientation;Safety/judgement;Attention;Memory;Following commands;Awareness;Problem solving                 Orientation Level: Disoriented to;Place;Situation Current Attention Level: Sustained Memory: Decreased short-term memory Following Commands: Follows multi-step commands with increased time Safety/Judgement: Decreased awareness of safety;Decreased awareness of deficits Awareness: Intellectual Problem Solving: Difficulty sequencing General Comments: Patient with slow response time.       General Comments      Exercises     Assessment/Plan    PT Assessment Patient needs continued PT services  PT Problem List Decreased strength;Decreased activity tolerance;Decreased balance;Decreased mobility;Decreased cognition;Decreased safety awareness       PT Treatment Interventions DME instruction;Gait training;Stair training;Functional mobility training;Therapeutic activities;Therapeutic exercise;Balance training;Patient/family education    PT Goals (Current goals can be found in the Care Plan section)  Acute Rehab PT Goals Patient Stated Goal: Get stronger PT Goal Formulation: With patient Time For Goal Achievement: 12/10/17 Potential to Achieve Goals: Good    Frequency Min 3X/week   Barriers to discharge Decreased caregiver support      Co-evaluation               AM-PAC PT "6 Clicks" Daily Activity  Outcome Measure Difficulty turning over in bed (including adjusting bedclothes, sheets and blankets)?: A Little Difficulty moving from lying on back to sitting on the side of the bed? : A Little Difficulty sitting down on and standing up from a chair with arms (e.g., wheelchair, bedside commode, etc,.)?: A Little Help needed moving to and from a bed to chair (including a wheelchair)?: A Little Help needed walking in hospital room?: A Little Help needed climbing 3-5 steps with a railing? : A Lot 6 Click Score: 17    End of Session   Activity Tolerance: Patient  tolerated treatment well Patient left: in chair;with call bell/phone within reach;with chair alarm set Nurse Communication: Mobility status PT Visit Diagnosis: Unsteadiness on feet (R26.81);Other abnormalities of gait and mobility (R26.89)    Time: 1530-1550 PT Time Calculation (min) (ACUTE ONLY): 20 min   Charges:   PT Evaluation $PT Eval Moderate Complexity: 1 Mod     PT G Codes:        Laurina Bustle, PT, DPT Acute Rehabilitation Services  Pager: [phone]   Vanetta Mulders 11/26/2017, 4:08 PM

## 2017-11-26 NOTE — Progress Notes (Signed)
Subjective: Seems brighter and more interactive than before.   Exam: Vitals:   11/26/17 0759 11/26/17 1214  BP: (!) 165/86 (!) 174/74  Pulse: 71 75  Resp: 18 18  Temp: 98.3 F (36.8 C) 97.7 F (36.5 C)  SpO2: 96% 97%   Gen: In bed, NAD Resp: non-labored breathing, no acute distress Abd: soft, nt  Neuro: MS: Awake, oriented to hospital, gives month as february, year as 1999 CN:VFF, slight dysconjugate gaze, face symmetric, palate elvates symmetrically.  Motor: 5/5 throughout Sensory:intact to LT  Impression: 84 yo M with focal seizure, suspect initially status epilepticus. He appears to be improving. Overall, may take quite some time to return to basline and possible that he might not fully recover. The rapid improvement thus far is encouraging .    Recommendations: 1) continue keppra 750mg  BID 2) continue vimpat 100mg  BID.   3) will follow.   Ritta SlotMcNeill Toron Bowring, MD [facility] [phone]  If 7pm- 7am, please page neurology on call as listed in AMION.

## 2017-11-26 NOTE — Progress Notes (Signed)
CM spoke to patients daughter over the phone about filling out the patient assistance information for Vimpat. She will take the paper work home and fill it out. CM following.

## 2017-11-26 NOTE — Care Management Note (Signed)
Case Management Note  Patient Details  Name: Seth Madden MRN: [id number] Date of Birth: 13-Mar-1931  Subjective/Objective:     Pt in with seizures. He is from home alone.                Action/Plan: Awaiting PT/OT recommendations. CM following for d/c needs, physician orders.   Expected Discharge Date:                  Expected Discharge Plan:     In-House Referral:     Discharge planning Services     Post Acute Care Choice:    Choice offered to:     DME Arranged:    DME Agency:     HH Arranged:    HH Agency:     Status of Service:  In process, will continue to follow  If discussed at Long Length of Stay Meetings, dates discussed:    Additional Comments:  [name], RN 11/26/2017, 11:16 AM

## 2017-11-26 NOTE — Progress Notes (Signed)
   I saw and examined the patient. I reviewed the resident's note and I agree with the resident's findings and plan as documented in the resident's note.  84 year old man admitted with acute encephalopathy and post-ictal state. Yesterday he had a partial seizure with foot twitching, then we started Vimpat in addition to Keppra. Today there is no more extremity twitching, no other signs of partial seizure. Mental status is much improved, though he is still disoriented to place and cannot yet understand his medical condition. Plan is for PT and OT consults today, I am hopeful his functional status will improve over the coming days. We will start to plan for dispo either to SNF or to home with family support.  Tyson AliasVincent, Cheryl Chay Thomas, MD 11/26/2017, 3:18 PM

## 2017-11-26 NOTE — Progress Notes (Signed)
   Subjective: Patient is sitting up in bed today went to the room.  He stated he is feeling much better and is ready to be discharged home when we feel he is ready.  Patient fails to clearly identify members of his family is unable to recall the events of the prior day.  He is in agreement remaining admitted until his symptoms improve.  He denied acute complaints today including headache, nausea, changes, nausea, vomiting, or diarrhea.  Objective:  Vital signs in last 24 hours: Vitals:   11/25/17 1700 11/25/17 2014 11/25/17 2353 11/26/17 0503  BP: (!) 147/75 133/70 (!) 150/88 (!) 177/85  Pulse: 78 71 75 76  Resp: 18 18 18 18   Temp: 98.3 F (36.8 C) 98 F (36.7 C) (!) 97.5 F (36.4 C) (!) 97.4 F (36.3 C)  TempSrc: Oral Oral Oral Oral  SpO2: 97% 97% 95% 95%  Weight:      Height:       ROS negative except as per HPI.  Physical exam: General: No acute distress, afebrile, conversant but minimally so Neuro: Alert and oriented only to self.  Patient is not aware of his situation, location, recent events, or date Cardiovascular: RRR, no murmurs rubs or gallops auscultated Pulmonary: Lungs clear to auscultation bilaterally in upper and lower lung fields Musculoskeletal: Patient's bilateral lower extremties are nonedematous, nontender   Assessment/Plan:  Active Problems:   Recurrent seizures (HCC)   Essential hypertension   Hyperlipidemia  Assessment:  Seth Madden is a 84 year old male with a PMHx notable for HLD, HTN, posterior circ stroke, possible seizure versus stroke in 2017 who presented with acute encephalopathy after being found down at his home. He was noted to be aphasic and incontinent of urine at baseline he is very dependent upon his own ADLs today during initial evaluation by EMS.  Recent possible stroke procedures of 2017 was able to use Keppra.  This was discontinued 1 month later as a similar felt with the event be related to seizure activity, likely with a  stroke.  Patient improved minimally on day 2 but continued to demonstrate possible focal seizure activity in his right distal lower extremity with EEG indicating possible left temporal dysfunction.  After the MRI failed to demonstrate an acute intracranial process he was placed on Keppra and Vimpat and observed for improvement given the lower likelihood that a stroke occurred.   Plan: Recurrent seizure: Acute encephalopathy: Patient's acute encephalopathy continues to improve.  However, he remains unable to explain his current situation, the findings location, the date, or recent events.  The patient does not appear to be stable for discharge home and only continued evaluation and treatment. --Neurology is following --Continue Vimpat 100mg  IV Q12 hours --Continue Keppra 750mg  IV Q12 hours  HLD: Total cholesterol 232, HDL 51, LDL 193.  Given patient's age and risk factors we will plan to put in the statin is recommended.  Again, we were discussed with the patient was more coherent.  Hypertension: Patient's pressure continued to remain mildly elevated.  --Amlodipine 5mg  daily  Dispo: Anticipated discharge in approximately 2-3 day(s).   Lanelle BalHarbrecht, Seth Dicesare, MD 11/26/2017, 6:45 AM Pager: Pager# [phone]

## 2017-11-26 NOTE — Care Management (Signed)
Pt started on Vimpat this admission. CM did benefits check to see cost for patient at d/c:    I HAVE CHECK WITH [employer] AND NO RX COVERAGE      PATIENT HAS GENERIC INS -NO RX COVERAGE   CM spoke to patient and he felt he did have drug coverage. CM called and spoke to his pharmacy ([facility] [address] [facility] states [facility] does not have [facility] coverage.  CM spoke to the representative with [employer] and she is willing to make sure the neurology office he f/u with has sample and [facility] was notified and will fax a patient assistance form to CM to begin the process.  If patient d/c with Vimpat he will receive 30 day free card. He will need a separate prescription for Vimpat to send to [employer] [facility] to complete the patient assistance paperwork. CM following.

## 2017-11-27 DIAGNOSIS — G40909 Epilepsy, unspecified, not intractable, without status epilepticus: Principal | ICD-10-CM

## 2017-11-27 LAB — BASIC METABOLIC PANEL WITH GFR
Anion gap: 10 (ref 5–15)
BUN: 12 mg/dL (ref 6–20)
CO2: 24 mmol/L (ref 22–32)
Calcium: 8.2 mg/dL — ABNORMAL LOW (ref 8.9–10.3)
Chloride: 105 mmol/L (ref 101–111)
Creatinine, Ser: 1.08 mg/dL (ref 0.61–1.24)
GFR calc Af Amer: >60 mL/min
GFR calc non Af Amer: >60 mL/min
Glucose, Bld: 109 mg/dL — ABNORMAL HIGH (ref 65–99)
Potassium: 4 mmol/L (ref 3.5–5.1)
Sodium: 139 mmol/L (ref 135–145)

## 2017-11-27 NOTE — Progress Notes (Signed)
   Subjective: The patient was sitting up on the edge of the bed today eating lunch upon entering the room. He denied acute concerns. He is aware that he had a medical issue that brought him to the hospital. He continues to be confused on his location, precise reason for visit and the date. He is potentially agreeable to SNF placement prior to being discharged home.   Objective:  Vital signs in last 24 hours: Vitals:   11/26/17 0759 11/26/17 1214 11/26/17 1900 11/27/17 0413  BP: (!) 165/86 (!) 174/74 (!) 161/84 139/65  Pulse: 71 75 76 73  Resp: 18 18  18   Temp: 98.3 F (36.8 C) 97.7 F (36.5 C) 98.4 F (36.9 C) 97.7 F (36.5 C)  TempSrc: Oral Oral Oral Oral  SpO2: 96% 97% 99% 98%  Weight:      Height:       ROS negative except as per HPI:  Physical Exam: General: in no acute distress, conversant, sitting upright without support Neuro: Alert, oriented only to self and general reason for his hospital stay Cardio: RRR Pulm: Mild crackles in the lower lung fields bilaterally Musc: bilateral lower extremities nontender nonedematous    Assessment/Plan:  Active Problems:   Recurrent seizures (HCC)   Essential hypertension   Hyperlipidemia  Assessment: Shinichi Brookshireis a 84 year old malewith a PMHx notable for HLD, HTN, posterior circ stroke, possibleseizure versus stroke in 2017who presented with acute encephalopathy after being found down at his home. Hewas noted to be aphasic and incontinent of urine upon arrival. At baseline he is very independent performing his own ADLs and driving. Recent possible posterior circ stroke in 2017 vs seizure as per chart review. He was eventually placed on Keppra while at the hospital given the evaluation there. This was discontinued 1 month later as it was felt by his local physician that the event was less related to seizure activity and more likely a stroke. The patient slowly improved during the current admission with some improvement  by day 2 but continued to demonstrate possible focal seizure activity in his right distal lower extremity with EEG indicating possible left temporal dysfunction. The initial MRI failed to demonstrate anacute intracranial process. He was placed on Keppra with Vimpat added following the focal event. Neurology believe this to most likely be related to a prolonged postictal state and less likely a stroke.   Plan: Recurrent seizure: Acute encephalopathy: Patient acute cervical he continues to improve daily, however, he remains unable to accurately describe his current situation or recall the previous day's events.  His condition is currently believed to be secondary to a prolonged postictal state from a seizure and/or persistent recurrent focal seizures. -Neurology continues to follow -Continue Vimpat 100 mg IV every 12 hours -Continue Keppra 750 mg IV every 12 hours  HLD: As before.  We discussed with the patient addition of a statin for recovery of mental status.  Given his risk factors and moderate statin recommended with the LDL of 183.  Hypertension: Patient's pressure continued to remain mildly elevated. Patients BP continues to remain elevated >140/90 with the exception of a single recording early 11/27/2017. -Amlodipine 5 mg daily  Placing consult to CSW and CM for assistance with SNF placement. They are contacting the daughter to obtain consent given his mental status.   Diet: Regular Code: DNR Fluids: n/a GI ppx: n/a VTE ppx: heparin Dispo: Anticipated discharge in approximately 2-3 day(s).   Lanelle BalHarbrecht, Apphia Cropley, MD 11/27/2017, 6:44 AM Pager: Pager# [phone]

## 2017-11-27 NOTE — Progress Notes (Signed)
   I saw and examined the patient. I reviewed the resident's note and I agree with the resident's findings and plan as documented in the resident's note.  Patient is doing well today, sitting up at the bedside eating his lunch.  He has no acute complaints.  He is still confused about why he is here in his medical condition.  But he is following commands, and otherwise stable.  We will plan to continue with current antiepileptic medications long-term.  Blood pressure looks better today.  Renal function improved.  Plan for discharge to skilled nursing facility per therapy recommendations when it can be arranged.   Tyson AliasVincent, Ginnie Marich Thomas, MD 11/27/2017, 1:49 PM

## 2017-11-27 NOTE — NC FL2 (Signed)
  [place] MEDICAID FL2 LEVEL OF CARE SCREENING TOOL     IDENTIFICATION  Patient Name: Seth Madden Birthdate: 05-31-1931 Sex: male Admission Date (Current Location): 11/24/2017  [facility] [place]   Facility and Address:  [facility], [address], [place], [place] [address]      Provider Number: [id number]  Attending Physician Name and Address:  Tyson AliasVincent, Duncan Thomas, *  Relative Name and Phone Number:       Current Level of Care: Hospital Recommended Level of Care: Skilled Nursing Facility Prior Approval Number:    Date Approved/Denied:   PASRR Number: [id number]  Discharge Plan: SNF    Current Diagnoses: Patient Active Problem List   Diagnosis Date Noted  . Essential hypertension 11/25/2017  . Hyperlipidemia 11/25/2017  . Recurrent seizures (HCC) 11/24/2017    Orientation RESPIRATION BLADDER Height & Weight     Self, Situation, Place  Normal Incontinent, External catheter(catheter placed 11/24/17) Weight: 129 lb 10.1 oz (58.8 kg) Height:  5\' 8"  (172.7 cm)  BEHAVIORAL SYMPTOMS/MOOD NEUROLOGICAL BOWEL NUTRITION STATUS    Convulsions/Seizures Continent Diet(regular)  AMBULATORY STATUS COMMUNICATION OF NEEDS Skin   Extensive Assist Verbally Normal                       Personal Care Assistance Level of Assistance  Bathing, Feeding, Dressing Bathing Assistance: Limited assistance Feeding assistance: Limited assistance Dressing Assistance: Limited assistance     Functional Limitations Info  Sight, Hearing, Speech Sight Info: Adequate Hearing Info: Adequate Speech Info: Adequate    SPECIAL CARE FACTORS FREQUENCY  PT (By licensed PT), OT (By licensed OT)     PT Frequency: 5x/wk OT Frequency: 5x/wk            Contractures Contractures Info: Not present    Additional Factors Info  Code Status, Allergies Code Status Info: DNR Allergies Info: NKA           Current Medications  (11/27/2017):  This is the current hospital active medication list Current Facility-Administered Medications  Medication Dose Route Frequency Provider Last Rate Last Dose  . 0.9 %  sodium chloride infusion   Intravenous Continuous Caryl PinaLindzen, Eric, MD   Stopped at 11/27/17 [phone]  . amLODipine (NORVASC) tablet 5 mg  5 mg Oral Daily Lanelle BalHarbrecht, [place], MD   5 mg at 11/26/17 1348  . aspirin EC tablet 81 mg  81 mg Oral Daily Eulah PontBlum, Nina, MD   81 mg at 11/26/17 0923  . heparin injection 5,000 Units  5,000 Units Subcutaneous Q8H Eulah PontBlum, Nina, MD   5,000 Units at 11/27/17 0520  . lacosamide (VIMPAT) 100 mg in sodium chloride 0.9 % 25 mL IVPB  100 mg Intravenous Q12H Rejeana BrockKirkpatrick, McNeill P, MD   Stopped at 11/27/17 0045  . levETIRAcetam (KEPPRA) 750 mg in sodium chloride 0.9 % 100 mL IVPB  750 mg Intravenous Q12H Eulah PontBlum, Nina, MD   Stopped at 11/26/17 2200  . LORazepam (ATIVAN) injection 1 mg  1 mg Intravenous Q2H PRN Eulah PontBlum, Nina, MD   1 mg at 11/24/17 2056     Discharge Medications: Please see discharge summary for a list of discharge medications.  Relevant Imaging Results:  Relevant Lab Results:   Additional Information SS#: [id number]  Baldemar LenisElizabeth M Esti Demello, LCSW

## 2017-11-27 NOTE — Progress Notes (Signed)
Physical Therapy Treatment Patient Details Name: Seth Madden MRN: [id number] DOB: 07-21-1931 Today's Date: 11/27/2017    History of Present Illness Pt. is a 84 y.o. M with significant PMH of hyperlipidemia, hypertension, history of left posterior stroke, who presented this morning with aphasia and acute encephalopathy in a post-ictal state. CT and MRI negative for acute abnormalities.      PT Comments    Patient is progressing very well towards their physical therapy goals. Ambulating 200 feet with RW and min guard assist. Able to ambulate 10 feet with no device and no overt LOB. Recommend continued gait training with no assistive device to challenge balance and determine follow up equipment needs. Patient continues with cognitive deficits including decreased safety awareness, problem solving, awareness, and orientation. Patient cannot live alone due to presenting as a fall risk and recommending SNF if family is not available for 24 hour supervision.     Follow Up Recommendations  Supervision/Assistance - 24 hour;SNF     Equipment Recommendations  None recommended by PT    Recommendations for Other Services       Precautions / Restrictions Precautions Precautions: Fall;Other (comment)(Seizure) Restrictions Weight Bearing Restrictions: No    Mobility  Bed Mobility Overal bed mobility: Modified Independent             General bed mobility comments: Patient modified independent with supine to sit  Transfers Overall transfer level: Needs assistance Equipment used: Rolling walker (2 wheeled) Transfers: Sit to/from [employer] Sit to Stand: Supervision         General transfer comment: Supervision for safety.  Ambulation/Gait Ambulation/Gait assistance: Min guard Ambulation Distance (Feet): 200 Feet Assistive device: Rolling walker (2 wheeled);None Gait Pattern/deviations: Step-through pattern;Decreased dorsiflexion - right;Decreased  dorsiflexion - left;Wide base of support Gait velocity: decreased Gait velocity interpretation: Below normal speed for age/gender General Gait Details: Patient demonstrating wide BOS, bilateral hip external rotation, and decreased bilateral heel strike at initial contact throughout gait. VC's for upright posture due to forward head and heel strike at initial contact. Patient able to achieve initially but not maintain. Ambulated last 10 feet with no device and min guard assist. Patient with wider BOS to maintain balance but no overt LOB.    Stairs            Wheelchair Mobility    Modified Rankin (Stroke Patients Only) Modified Rankin (Stroke Patients Only) Pre-Morbid Rankin Score: No significant disability Modified Rankin: Moderately severe disability     Balance Overall balance assessment: Needs assistance Sitting-balance support: No upper extremity supported;Feet supported Sitting balance-Leahy Scale: Good Sitting balance - Comments: Able to adjust socks   Standing balance support: No upper extremity supported;During functional activity Standing balance-Leahy Scale: Fair Standing balance comment: Min guard for dynamic balance with no UE support                            Cognition Arousal/Alertness: Awake/alert Behavior During Therapy: Impulsive Overall Cognitive Status: Impaired/Different from baseline Area of Impairment: Orientation;Safety/judgement;Attention;Memory;Following commands;Awareness;Problem solving                 Orientation Level: Disoriented to;Place;Situation;Time Current Attention Level: Sustained Memory: Decreased short-term memory Following Commands: Follows one step commands with increased time Safety/Judgement: Decreased awareness of safety;Decreased awareness of deficits Awareness: Emergent Problem Solving: Difficulty sequencing;Slow processing;Requires verbal cues General Comments: Patient with slow response time.        Exercises      General Comments  General comments (skin integrity, edema, etc.): Patient able recall age with increased time, place, and month of the year. Stated year [id number]. Unable to recall what season it is. Able to recall [place] with increased time and cueing.       Pertinent Vitals/Pain Pain Assessment: No/denies pain Faces Pain Scale: No hurt    Home Living Family/patient expects to be discharged to:: Private residence Living Arrangements: Alone   Type of Home: House Home Access: Stairs to enter   Home Layout: One level Home Equipment: None Additional Comments: Unsure of home set up due to confusion    Prior Function Level of Independence: Independent      Comments: Pt reporting he is independent with ADLs, IADLs, driving, and carring for his dog Rex. Pt also reporting his wife passed away 6 months ago   PT Goals (current goals can now be found in the care plan section) Acute Rehab PT Goals Patient Stated Goal: Get stronger PT Goal Formulation: With patient Time For Goal Achievement: 12/10/17 Potential to Achieve Goals: Good Progress towards PT goals: Progressing toward goals    Frequency    Min 3X/week      PT Plan Current plan remains appropriate    Co-evaluation              AM-PAC PT "6 Clicks" Daily Activity  Outcome Measure  Difficulty turning over in bed (including adjusting bedclothes, sheets and blankets)?: A Little Difficulty moving from lying on back to sitting on the side of the bed? : A Little Difficulty sitting down on and standing up from a chair with arms (e.g., wheelchair, bedside commode, etc,.)?: A Little Help needed moving to and from a bed to chair (including a wheelchair)?: A Little Help needed walking in hospital room?: A Little Help needed climbing 3-5 steps with a railing? : A Lot 6 Click Score: 17    End of Session Equipment Utilized During Treatment: Gait belt Activity Tolerance: Patient  tolerated treatment well Patient left: in bed;with bed alarm set;with call bell/phone within reach Nurse Communication: Mobility status PT Visit Diagnosis: Unsteadiness on feet (R26.81);Other abnormalities of gait and mobility (R26.89)     Time: 4098-1191 PT Time Calculation (min) (ACUTE ONLY): 13 min  Charges:  $Gait Training: 8-22 mins                    G Codes:       Laurina Bustle, PT, DPT Acute Rehabilitation Services  Pager: [phone]    Vanetta Mulders 11/27/2017, 9:34 AM

## 2017-11-27 NOTE — Progress Notes (Signed)
  Speech Language Pathology Treatment: Dysphagia  Patient Details Name: Seth Madden MRN: [id number] DOB: [birth year] Today's Date: 11/27/2017 Time: 2725-3664 SLP Time Calculation (min) (ACUTE ONLY): 12 min  Assessment / Plan / Recommendation Clinical Impression  Pt presents with no signs concerning for aspiration and functional oral phase during PO intake consistent with current diet recommendation. Pt used liquid wash with Mod I to ensure clearance of oral cavity. Aspiration precautions reinforced to ensure pt's safety with diet recommendations with pt verbalizing compliance to precautions. Continue to recommend regular solids and thin liquid diet with aspiration precautions. Given pt's observed independence and safety with diet, SLP services no longer indicated for dysphagia. Please re-consult in the event of acute changes.May consider SLP f/u at next level of care if cognitive changes persist.    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 No CXR.      SLP Plan  Discharge SLP treatment due to (comment)(goals met)       Recommendations  Diet recommendations: Regular;Thin liquid Liquids provided via: Cup;Straw Medication Administration: Whole meds with liquid Supervision: Patient able to self feed;Intermittent supervision to cue for compensatory strategies Compensations: Minimize environmental distractions;Slow rate;Small sips/bites Postural Changes and/or Swallow Maneuvers: Seated upright 90 degrees                Oral Care Recommendations: Oral care BID Follow up Recommendations: None SLP Visit  Diagnosis: Dysphagia, oral phase (R13.11) Plan: Discharge SLP treatment due to (comment)(goals met)       GO              [place] Serin Thornell SLP Student Clinician   [place] Arren Laminack 11/27/2017, 4:46 PM

## 2017-11-27 NOTE — Progress Notes (Signed)
Patients daughter Darl Pikes(Susan) arrived with papers for assistance program, which staff placed in chart, and SW will be notied

## 2017-11-27 NOTE — Progress Notes (Signed)
CSW following for discharge plan. CSW acknowledging consult for SNF placement and therapy evaluations recommending SNF. CSW called and left voicemail for patient's daughter, Darl PikesSusan, because patient is having confusion.   CSW will continue to follow.  Blenda NicelyElizabeth Wilmot Quevedo, [place] Clinical Social Worker [phone]

## 2017-11-27 NOTE — Evaluation (Signed)
Occupational Therapy Evaluation Patient Details Name: Seth Madden MRN: [id number] DOB: 1930/11/16 Today's Date: 11/27/2017    History of Present Illness Pt. is a 84 y.o. M with significant PMH of hyperlipidemia, hypertension, history of left posterior stroke, who presented this morning with aphasia and acute encephalopathy in a post-ictal state. CT and MRI negative for acute abnormalities.     Clinical Impression   Pt reporting that he lived at home alone and was independent PTA; pt reporting that his wife passed away 6 months ago. Pt currently requiring Min Guard-Min A for ADLs and functional mobility due to poor balance and safety awareness. Pt with decreased cognition requiring increased time and cues for processing, problem solving, and safety awareness. Pt is a high fall risk and would be unsafe to perform ADLs and IADLs at home alone. Pt would benefit from further acute OT to facilitate safe dc. Recommend dc to SNF for further OT to optimize safety and independence with ADLs and functional mobility. If family able to provide 24/7 supervision/assistance, pt may be able to progress to home with HHOT.    Follow Up Recommendations  SNF;Supervision/Assistance - 24 hour    Equipment Recommendations  Other (comment)(Defer to next venue)    Recommendations for Other Services PT consult     Precautions / Restrictions Precautions Precautions: Fall;Other (comment)(Seizures) Restrictions Weight Bearing Restrictions: No      Mobility Bed Mobility Overal bed mobility: Modified Independent             General bed mobility comments: Pt able to perform bed mobility with no physical A. requiring increased time for managing blackets  Transfers Overall transfer level: Needs assistance Equipment used: None Transfers: Sit to/from Stand Sit to Stand: Min guard         General transfer comment: MI nGuard A for safety. Pt impulsive and requiring simple, direct cues for safety     Balance Overall balance assessment: Needs assistance Sitting-balance support: No upper extremity supported;Feet supported Sitting balance-Leahy Scale: Good Sitting balance - Comments: Able to adjust socks   Standing balance support: Single extremity supported;During functional activity Standing balance-Leahy Scale: Fair Standing balance comment: Able to standing at sink for hand hygiene without UE support                           ADL either performed or assessed with clinical judgement   ADL Overall ADL's : Needs assistance/impaired Eating/Feeding: Set up;Sitting;Supervision/ safety(Increased time) Eating/Feeding Details (indicate cue type and reason): Pt with decreased processing to  Grooming: Wash/dry hands;Min guard;Standing Grooming Details (indicate cue type and reason): Pt performing hand hygiene with MIn Guard A for safety due to decreased balance and poor safety awareness. Pt requiring MIn VCs for using soap during hand hygiene. Pt stating "oh I don't need soap." Upper Body Bathing: Min guard;Sitting   Lower Body Bathing: Minimal assistance;Sit to/from stand   Upper Body Dressing : Min guard;Sitting   Lower Body Dressing: Minimal assistance;Sit to/from stand Lower Body Dressing Details (indicate cue type and reason): Pt able to bring ankles up to EOB for adjusting socks. Pt requiring Min A for dynamic standing balance during ADLs.  Toilet Transfer: Minimal assistance;Ambulation(Simulated to recliner) Toilet Transfer Details (indicate cue type and reason): Pt requiring Min A for balance and safe technique.          Functional mobility during ADLs: Minimal assistance(Pt furnature walking and declines single HHA) General ADL Comments: Pt with decreased cognition, safety awareness,  and balance. Pt high fall risk and would not be safe to complete ADLs and IADLs at home alone.      Vision Baseline Vision/History: Wears glasses Wears Glasses: ("I wear them as I  need them") Patient Visual Report: No change from baseline Additional Comments: Further assess next session     Perception     Praxis      Pertinent Vitals/Pain Pain Assessment: No/denies pain     Hand Dominance Right   Extremity/Trunk Assessment Upper Extremity Assessment Upper Extremity Assessment: Overall WFL for tasks assessed   Lower Extremity Assessment Lower Extremity Assessment: Defer to PT evaluation   Cervical / Trunk Assessment Cervical / Trunk Assessment: Kyphotic   Communication Communication Communication: No difficulties   Cognition Arousal/Alertness: Awake/alert Behavior During Therapy: WFL for tasks assessed/performed;Impulsive Overall Cognitive Status: Impaired/Different from baseline Area of Impairment: Orientation;Safety/judgement;Attention;Memory;Following commands;Awareness;Problem solving                 Orientation Level: Disoriented to;Time;Place Current Attention Level: Sustained Memory: Decreased short-term memory Following Commands: Follows one step commands inconsistently;Follows one step commands with increased time Safety/Judgement: Decreased awareness of deficits;Decreased awareness of safety Awareness: Emergent Problem Solving: Slow processing;Difficulty sequencing;Requires verbal cues General Comments: Pt with decreased problem solving and plow processing during ADLs. For example, requiring increased time and cues to use hand soap duirng hand hygiene at sink. Also requiring increased time to locate fork with utensils during self feeding (fork folded up in napkin). Pt not oriented to time stating it was "May" and year of 1999. Pt able to problem solve he is at [facility] with Min VCs   General Comments       Exercises     Shoulder Instructions      Home Living Family/patient expects to be discharged to:: Private residence Living Arrangements: Alone   Type of Home: House Home Access: Stairs to enter [employer] of Steps: 1   Home Layout: One level     Bathroom Shower/Tub: Chief Strategy Officer: Standard     Home Equipment: None   Additional Comments: Unsure of home set up due to confusion      Prior Functioning/Environment Level of Independence: Independent        Comments: Pt reporting he is independent with ADLs, IADLs, driving, and carring for his dog Rex. Pt also reporting his wife passed away 6 months ago        OT Problem List: Decreased strength;Decreased activity tolerance;Impaired balance (sitting and/or standing);Decreased cognition;Decreased safety awareness;Decreased knowledge of use of DME or AE;Decreased knowledge of precautions      OT Treatment/Interventions: Self-care/ADL training;Therapeutic exercise;Energy conservation;DME and/or AE instruction;Therapeutic activities;Patient/family education;Cognitive remediation/compensation    OT Goals(Current goals can be found in the care plan section) Acute Rehab OT Goals Patient Stated Goal: Get home to his dog Rex OT Goal Formulation: With patient Time For Goal Achievement: 12/11/17 Potential to Achieve Goals: Good ADL Goals Pt Will Perform Grooming: with modified independence;standing Pt Will Perform Upper Body Dressing: with modified independence;sitting Pt Will Perform Lower Body Dressing: with modified independence;sit to/from stand Pt Will Transfer to Toilet: with modified independence;ambulating;regular height toilet Pt Will Perform Toileting - Clothing Manipulation and hygiene: with modified independence;sit to/from stand  OT Frequency: Min 2X/week   Barriers to D/C: Decreased caregiver support  Pt lives home alone       Co-evaluation              AM-PAC PT "6 Clicks" Daily Activity  Outcome Measure Help from another person eating meals?: None Help from another person taking care of personal grooming?: A Little Help from another person toileting, which includes using  toliet, bedpan, or urinal?: A Little Help from another person bathing (including washing, rinsing, drying)?: A Little Help from another person to put on and taking off regular upper body clothing?: A Little Help from another person to put on and taking off regular lower body clothing?: A Little 6 Click Score: 19   End of Session Equipment Utilized During Treatment: Gait belt Nurse Communication: Mobility status  Activity Tolerance: Patient tolerated treatment well Patient left: in chair;with call bell/phone within reach;with chair alarm set  OT Visit Diagnosis: Unsteadiness on feet (R26.81);Other abnormalities of gait and mobility (R26.89);Muscle weakness (generalized) (M62.81);Other symptoms and signs involving cognitive function                Time: 4696-2952 OT Time Calculation (min): 17 min Charges:  OT General Charges $OT Visit: 1 Visit OT Evaluation $OT Eval Moderate Complexity: 1 Mod G-Codes:     Jalessa Peyser MSOT, OTR/L Acute Rehab Pager: [phone] Office: [phone]  Theodoro Grist Rowyn Mustapha 11/27/2017, 8:44 AM

## 2017-11-27 NOTE — Progress Notes (Addendum)
Subjective: Alert and talkative. Knows where he is and month , but stated it is 211999.  Follows all commands.   Called his daughter--his baseline is cognitively intact, knew world issues, read paper every morning, would drive 30 to [name] to see his sister and other friends, able to hold a cognitive conversation. Had a daily routine in which he would get up-have coffee watch game shows-go to friends house and get home by 4 pm. Daughter feels he has significantly improved but usually would know the date.  Exam: Vitals:   11/27/17 0413 11/27/17 0853  BP: 139/65 126/75  Pulse: 73 85  Resp: 18 18  Temp: 97.7 F (36.5 C) 98 F (36.7 C)  SpO2: 98% 96%    Physical Exam   Extremities- Warm, dry and intact Musculoskeletal-no joint tenderness, deformity or swelling Skin-warm and dry, no hyperpigmentation, vitiligo, or suspicious lesions    Neuro:  Neuro: MS: Awake, oriented to hospital, gives month as April, year as 1999 CN:VFF, slight dysconjugate gaze, face symmetric, palate elvates symmetrically.  Motor: 5/5 throughout Sensory:intact to LT    [name] [facility] [phone]  Impression:  84 yo M with focal seizure, suspect initially status epilepticus. He appears to be improving. Overall, may take quite some time to return to basline and possible that he might not fully recover.  I continue to be encouraged by his improvement.   1) I would continue his current antiepileptic regimen of Keppra 750 mg twice daily (renally dosed) as well as Vimpat 100 mg twice daily 2) no further inpatient neurological recommendations at this time, please call with further questions or concerns.    [name], MD [facility] [phone]  If 7pm- 7am, please page neurology on call as listed in AMION. 11/27/2017, 10:21 AM

## 2017-11-28 DIAGNOSIS — Z66 Do not resuscitate: Secondary | ICD-10-CM

## 2017-11-28 MED ORDER — LACOSAMIDE 100 MG PO TABS: 100.0000 mg | ORAL_TABLET | Freq: Two times a day (BID) | ORAL | 1 refills | Status: DC

## 2017-11-28 MED ORDER — LEVETIRACETAM 750 MG PO TABS: 750.0000 mg | ORAL_TABLET | Freq: Two times a day (BID) | ORAL | 1 refills | Status: DC

## 2017-11-28 MED ORDER — LEVETIRACETAM 750 MG PO TABS: 750.0000 mg | ORAL_TABLET | Freq: Two times a day (BID) | ORAL | Status: DC

## 2017-11-28 MED ORDER — AMLODIPINE BESYLATE 5 MG PO TABS: 5.0000 mg | ORAL_TABLET | Freq: Every day | ORAL | 2 refills | Status: AC

## 2017-11-28 MED ORDER — LACOSAMIDE 50 MG PO TABS
100.0000 mg | ORAL_TABLET | Freq: Two times a day (BID) | ORAL | Status: DC
Start: 2017-11-28 — End: 2017-11-28

## 2017-11-28 NOTE — Care Management Note (Signed)
Case Management Note  Patient Details  Name: Seth Madden MRN: [id number] Date of Birth: [birth year]  Subjective/Objective:                    Action/Plan: [facility].  CM completed and faxed the patient assistance form to [employer] to try and get assistance from the company for the cost of his Vimpat. CM also provided in the d/c pack a 30 day free card for the patient. Daughter updated.    Expected Discharge Date:  11/28/17               Expected Discharge Plan:  Skilled Nursing Facility  In-House Referral:  Clinical Social Work  Discharge planning Services  CM Consult, Medication Assistance  Post Acute Care Choice:    Choice offered to:     DME Arranged:    DME Agency:     HH Arranged:    HH Agency:     Status of Service:  Completed, signed off  If discussed at [employer] Meetings, dates discussed:    Additional Comments:  Kermit BaloKelli F Jyasia Markoff, RN 11/28/2017, 2:49 PM

## 2017-11-28 NOTE — Care Management Important Message (Signed)
Important Message  Patient Details  Name: Bayard Malesherian Northcraft MRN: [id number] Date of Birth: 1931/07/03   Medicare Important Message Given:  Yes    Barth Trella Stefan ChurchBratton 11/28/2017, 10:59 AM

## 2017-11-28 NOTE — Progress Notes (Signed)
Patient transferred from [facility] by [facility]. No new concerns IV removed. Report called to RN.

## 2017-11-28 NOTE — Progress Notes (Signed)
Physical Therapy Treatment Patient Details Name: Seth Madden MRN: [id number] DOB: 24-May-1931 Today's Date: 11/28/2017    History of Present Illness Pt. is a 84 y.o. M with significant PMH of hyperlipidemia, hypertension, history of left posterior stroke, who presented this morning with aphasia and acute encephalopathy in a post-ictal state. CT and MRI negative for acute abnormalities.      PT Comments    Patient cognitive status seems to remain the same. Upon arrival, patient sitting EOB attempting to stand up without assistance to go to the bathroom. Not oriented to place, time, or situation, stating he is in a nursing home. Is able to correctly state where he is when given options and verbal cueing. Ambulated 325 feet with no AD and no overt LOB. Able to climb 5 steps with bilateral railings. Will continue to challenge balance, functional strength, and functional cognition.     Follow Up Recommendations  Supervision/Assistance - 24 hour;SNF     Equipment Recommendations  None recommended by PT    Recommendations for Other Services       Precautions / Restrictions Precautions Precautions: Fall;Other (comment)(Seizure) Restrictions Weight Bearing Restrictions: No    Mobility  Bed Mobility Overal bed mobility: Modified Independent             General bed mobility comments: Patient modified independent with supine to sit  Transfers Overall transfer level: Needs assistance Equipment used: None Transfers: Sit to/from Stand;Stand Pivot Transfers Sit to Stand: Supervision         General transfer comment: Supervision for safety.  Ambulation/Gait Ambulation/Gait assistance: Min guard Ambulation Distance (Feet): 325 Feet Assistive device: None Gait Pattern/deviations: Step-through pattern;Decreased dorsiflexion - right;Decreased dorsiflexion - left;Wide base of support;Staggering left Gait velocity: decreased   General Gait Details: Patient ambulating with no  AD to challenge balance. Requiring min guard but no overt LOB. Tends to furniture walk when he is able. Patient with decreased stride length, wider BOS, min left lateral lean, bilateral hip external rotation and decreased arm swing. VC's to look up during ambulation.    Stairs Stairs: Yes   Stair Management: Two rails Number of Stairs: 5 General stair comments: Step over step pattern with ascending; step by step pattern with descending  Wheelchair Mobility    Modified Rankin (Stroke Patients Only) Modified Rankin (Stroke Patients Only) Pre-Morbid Rankin Score: No significant disability Modified Rankin: Moderately severe disability     Balance Overall balance assessment: Needs assistance Sitting-balance support: No upper extremity supported;Feet supported Sitting balance-Leahy Scale: Good     Standing balance support: No upper extremity supported;During functional activity Standing balance-Leahy Scale: Fair Standing balance comment: Min guard for dynamic balance with no UE support                            Cognition Arousal/Alertness: Awake/alert Behavior During Therapy: Impulsive Overall Cognitive Status: Impaired/Different from baseline Area of Impairment: Orientation;Safety/judgement;Attention;Memory;Following commands;Awareness;Problem solving                 Orientation Level: Disoriented to;Place;Situation;Time Current Attention Level: Sustained Memory: Decreased short-term memory Following Commands: Follows one step commands with increased time Safety/Judgement: Decreased awareness of safety;Decreased awareness of deficits Awareness: Emergent Problem Solving: Difficulty sequencing;Slow processing;Requires verbal cues General Comments: Patient with slow response time. Disoriented to place; stated he was at nursing home. Able to correctly state he was in a hospital in [place] when given 3 options to choose from.       Exercises  General  Comments   Cognitive tasks and dual tasking during gait I.e. Naming types of food      Pertinent Vitals/Pain Pain Assessment: No/denies pain Faces Pain Scale: No hurt    Home Living                      Prior Function            PT Goals (current goals can now be found in the care plan section) Acute Rehab PT Goals Patient Stated Goal: Get stronger PT Goal Formulation: With patient Time For Goal Achievement: 12/10/17 Potential to Achieve Goals: Good Progress towards PT goals: Progressing toward goals    Frequency    Min 3X/week      PT Plan Current plan remains appropriate    Co-evaluation              AM-PAC PT "6 Clicks" Daily Activity  Outcome Measure  Difficulty turning over in bed (including adjusting bedclothes, sheets and blankets)?: A Little Difficulty moving from lying on back to sitting on the side of the bed? : A Little Difficulty sitting down on and standing up from a chair with arms (e.g., wheelchair, bedside commode, etc,.)?: A Little Help needed moving to and from a bed to chair (including a wheelchair)?: A Little Help needed walking in hospital room?: A Little Help needed climbing 3-5 steps with a railing? : A Lot 6 Click Score: 17    End of Session Equipment Utilized During Treatment: Gait belt Activity Tolerance: Patient tolerated treatment well Patient left: in bed;with bed alarm set;with call bell/phone within reach Nurse Communication: Mobility status PT Visit Diagnosis: Unsteadiness on feet (R26.81);Other abnormalities of gait and mobility (R26.89)     Time: [id number] PT Time Calculation (min) (ACUTE ONLY): 24 min  Charges:  $Gait Training: 8-22 mins $Therapeutic Activity: 8-22 mins                    G Codes:       Laurina Bustlearoline Lang Zingg, PT, DPT Acute Rehabilitation Services  Pager: [phone]    Vanetta MuldersCarloine H Leisl Spurrier 11/28/2017, 12:13 PM

## 2017-11-28 NOTE — Discharge Summary (Addendum)
Name: Seth Madden MRN: [id number] DOB: 09/03/1930 84 y.o. PCP: Mauricia Area, MD  Date of Admission: 11/24/2017  8:42 AM Date of Discharge: 11/28/2017 Attending Physician: Tyson Alias, *MD  Discharge Diagnosis: Active Problems:   Recurrent seizures [facility])   Essential hypertension   Hyperlipidemia   Discharge Medications: Allergies as of 11/28/2017   No Known Allergies     Medication List    TAKE these medications   amLODipine 5 MG tablet Commonly known as:  NORVASC Take 1 tablet (5 mg total) by mouth daily. Start taking on:  11/29/2017   aspirin EC 81 MG tablet Take 81 mg by mouth daily.   Lacosamide 100 MG Tabs Take 1 tablet (100 mg total) by mouth every 12 (twelve) hours.   levETIRAcetam 750 MG tablet Commonly known as:  KEPPRA Take 1 tablet (750 mg total) by mouth every 12 (twelve) hours. What changed:    medication strength  how much to take  when to take this       Disposition and follow-up:   Mr.Aleric Schiffer was discharged from [facility] in Stable condition.  At the hospital follow up visit please address:  1.  Seizure disorder: follow-up with neurology as an outpatient, review for antiepileptic side effects.      HLD: Patient may benefit from a statin based on his LDL of 183 this admission. However, given that recent evidence suggests no mortality benefit for primary CAD prevention in people over age 9 he may opt to forgo this therapy.      May consider statin therapy for secondary stroke prevention as well.       HTN: Initiated therapy with a low dose CCB, Amlodipine 5mg . Please assess for continued need or medication titration.  2.  Labs / imaging needed at time of follow-up: BMP for renal function  3.  Pending labs/ test needing follow-up: n/a  Follow-up Appointments: Follow-up Information    Seltzer, Damian Leavell, MD Follow up.   Specialty:  Oncology Contact information: [address] [place]  [address] [phone]           Hospital Course by problem list: Active Problems:   Recurrent seizures [facility])   Essential hypertension   Hyperlipidemia   Assessment: Seth Brookshireis a 84 year old malewith a PMHx notable for HLD, HTN, posterior circ stroke, possibleseizure versus stroke in 2017who presented with acute encephalopathy after being found down at his home. Hewas noted to be aphasic and incontinent of urine upon arrival. At baseline he is very independent, performing his own ADLs including driving to acquire his groceries etc. Recent possible posterior circ stroke in 2017 vs seizure as per chart review was noted but otherwise unremarkable. He was eventually placed on Keppra during that hospitalization. Thiss was discontinued 1 month later as it was felt by his local physician that the event was less related to seizure activity and more likely a stroke.   The patient slowly improved during the current admission. By day two there was a notable focal seizure involving his right distal lower extremity. EEG indicated possible left temporal dysfunction. The initial MRI failed to demonstrate anacute intracranial process which was reassuring. He was placed on Keppra with Vimpat added following the focal event. Neurology believe this to most likely be related to a prolonged postictal state and less likely a stroke. He was determined to be stable for discharge to a skilled nursing facility and transitioned to PO antiepileptics.  HLD: He remained unable to discuss  this therapy with him during the admission. The patients LDL of 183, total 252, HDL 51. I would suggest discussing the benefits vs risks of initiating statin therapy.   HTN: The patients BP was moderately well controlled but he continued to experience hypertensive events at times. He was placed on Amlodipine 5mg  daily which may need to be adjusted.  Discharge Vitals:   BP (!) 147/71 (BP Location: Right Arm)   Pulse 65    Temp 98.2 F (36.8 C) (Oral)   Resp 16   Ht 5\' 8"  (1.727 m)   Wt 129 lb 10.1 oz (58.8 kg)   SpO2 98%   BMI 19.71 kg/m   Pertinent Labs, Studies, and Procedures:  CMP Latest Ref Rng & Units 11/27/2017 11/26/2017 11/25/2017  Glucose 65 - 99 mg/dL 161(W109(H) 92 96  BUN 6 - 20 mg/dL 12 20 96(E23(H)  Creatinine 0.61 - 1.24 mg/dL 4.541.08 0.981.22 1.19(J1.41(H)  Sodium 135 - 145 mmol/L 139 140 138  Potassium 3.5 - 5.1 mmol/L 4.0 3.5 3.7  Chloride 101 - 111 mmol/L 105 105 103  CO2 22 - 32 mmol/L 24 24 24   Calcium 8.9 - 10.3 mg/dL 8.2(L) 7.9(L) 8.6(L)  Total Protein 6.5 - 8.1 g/dL - - -  Total Bilirubin 0.3 - 1.2 mg/dL - - -  Alkaline Phos 38 - 126 U/L - - -  AST 15 - 41 U/L - - -  ALT 17 - 63 U/L - - -   CBC Latest Ref Rng & Units 11/24/2017 11/24/2017  WBC 4.0 - 10.5 K/uL - 10.5  Hemoglobin 13.0 - 17.0 g/dL 47.813.9 29.513.3  Hematocrit 62.139.0 - 52.0 % 41.0 39.4  Platelets 150 - 400 K/uL - 150   MRI Brain WO contrast: IMPRESSION: 1. Motion degraded study without acute finding. No visible infarct or discrete seizure focus. 2. 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 3. Moderate white matter disease, usually chronic small vessel Ischemia.  CT Head 11/24/2017: IMPRESSION:  1. No acute finding.ASPECTS is 10  2. Possible NPH.  3. Atrophy and chronic small vessel ischemia.  CT angio Head/Neck 11/24/2017: IMPRESSION: 1. No emergent large vessel occlusion. No infarct or penumbra by CT perfusion. 2. Cervical atherosclerosis with mild plaque irregularity at the left ICA bulb. 3. 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 4. Mild intracranial atherosclerosis without flow limiting stenosis. 5. Aortic Atherosclerosis (ICD10-I70.0) and Emphysema (ICD10-J43.9). 6. Possible NPH.  Discharge Instructions: Discharge Instructions    Diet - low sodium heart healthy   Complete by:  As directed    Increase activity slowly   Complete by:  As directed      Signed: Lanelle BalHarbrecht,  Cythnia Osmun, MD 11/28/2017, 2:09 PM   Pager: Pager# [phone]

## 2017-11-28 NOTE — Clinical Social Work Note (Signed)
Clinical Social Work Assessment  Patient Details  Name: Seth Madden MRN: [id number] Date of Birth: September 21, 1930  Date of referral:  11/28/17               Reason for consult:  Facility Placement                Permission sought to share information with:  Facility Medical sales representativeContact Representative, Family Supports Permission granted to share information::  Yes, Verbal Permission Granted  Name::     Training and development officerusan  Agency::  SNF  Relationship::  Daughter  Contact Information:     Housing/Transportation Living arrangements for the past 2 months:  Single Family Home Source of Information:  Adult Children Patient Interpreter Needed:  None Criminal Activity/Legal Involvement Pertinent to Current Situation/Hospitalization:  No - Comment as needed Significant Relationships:  Adult Children Lives with:  Self Do you feel safe going back to the place where you live?  Yes Need for family participation in patient care:  Yes (Comment)(patient not oriented)  Care giving concerns:  Patient lives home alone, even though the daughter has been trying to get him to come and stay with her and her husband. Patient will benefit from short term rehab at discharge.   Social Worker assessment / plan:  CSW spoke with patient's daughter over the phone to discuss recommendation for SNF. CSW explained reasoning for SNF recommendation and how patient is unsafe to be alone due to cognitive deficits. CSW received permission to look into SNF placement, while patient's daughter contacts patient's son to confirm that he is in agreement. CSW to continue to follow.  Employment status:  Retired Health and safety inspectornsurance information:  Medicare PT Recommendations:  Skilled Nursing Facility Information / Referral to community resources:  Skilled Nursing Facility  Patient/Family's Response to care:  Patient's daughter agreeable to SNF.  Patient/Family's Understanding of and Emotional Response to Diagnosis, Current Treatment, and Prognosis:  Patient's  daughter discussed how the patient had been recommended for SNF previously, but then was able to walk so they took him home instead. Patient's daughter understood that even though the patient is able to walk, that he still needs 24 hour care at this time due to cognitive deficits that will hopefully improve over time. Patient's daughter said she'd agree to think about it and call her brother to make sure he's in agreement.  Emotional Assessment Appearance:  Appears stated age Attitude/Demeanor/Rapport:  Unable to Assess Affect (typically observed):  Unable to Assess Orientation:    Alcohol / Substance use:  Not Applicable Psych involvement (Current and /or in the community):  No (Comment)  Discharge Needs  Concerns to be addressed:  Care Coordination Readmission within the last 30 days:  No Current discharge risk:  Lives alone, Dependent with Mobility, Cognitively Impaired Barriers to Discharge:  Continued Medical Work up   Dollar GeneralElizabeth M Rosaria Kubin, LCSW 11/28/2017, 4:13 PM

## 2017-11-28 NOTE — Progress Notes (Signed)
   I saw and examined the patient. I reviewed the resident's note and I agree with the resident's findings and plan as documented in the resident's note.  Patient is doing well, he has had no further seizures in over 48 hours. We can transition Keppra and Vimpat to oral medications today. His post-ictal encephalopathy is improving each day, he was reading the newspaper this morning, but still confused about his medical condition, thinks he had a "light stroke." I think he is medically ready for discharge, PT is recommending SNF which seems reasonable. The patient lives alone and has limited family support right now.   Tyson AliasVincent, Duncan Thomas, MD 11/28/2017, 11:58 AM

## 2017-11-28 NOTE — Clinical Social Work Placement (Signed)
Nurse to call report to [phone]    CLINICAL SOCIAL WORK PLACEMENT  NOTE  Date:  11/28/2017  Patient Details  Name: Seth Madden MRN: [id number] Date of Birth: [birth year]  Clinical Social Work is seeking post-discharge placement for this patient at the Skilled  Nursing Facility level of care (*CSW will initial, date and re-position this form in  chart as items are completed):  Yes   Patient/family provided with [facility] Clinical Social Work Department's list of facilities offering this level of care within the geographic area requested by the patient (or if unable, by the patient's family).  Yes   Patient/family informed of their freedom to choose among providers that offer the needed level of care, that participate in Medicare, Medicaid or managed care program needed by the patient, have an available bed and are willing to accept the patient.  Yes   Patient/family informed of [facility]'s ownership interest in [facility] and [facility], as well as of the fact that they are under no obligation to receive care at these facilities.  PASRR submitted to EDS on       PASRR number received on       Existing PASRR number confirmed on       FL2 transmitted to all facilities in geographic area requested by pt/family on 11/28/17     FL2 transmitted to all facilities within larger geographic area on       Patient informed that his/her managed care company has contracts with or will negotiate with certain facilities, including the following:        Yes   Patient/family informed of bed offers received.  [facility]     Physician recommends and patient chooses bed at      [facility].  Patient to be transferred to facility by [facility]     Patient family notified on 11/28/17 of transfer.  Name of family member notified:  Darl PikesSusan     PHYSICIAN       Additional Comment:     _______________________________________________ Baldemar LenisElizabeth M Kenadie Royce, LCSW 11/28/2017, 4:16 PM

## 2017-11-28 NOTE — Progress Notes (Addendum)
   Subjective: The patient was sitting up in his chair today eating breakfast upon entering the room.  [place] attested being [place], and that he was most likely there due to a stroke.  He was again reminded of his location and probable diagnosis of a seizure inducing the current confusion and symptoms.  He is reluctantly agreeable to discharge to skilled nursing facility to better improve his strength prior to being sent home.  Objective:  Vital signs in last 24 hours: Vitals:   11/27/17 1640 11/27/17 2016 11/27/17 2329 11/28/17 0435  BP: 139/85 (!) 168/76 140/70 107/68  Pulse: 77 68 70 75  Resp: 15  17   Temp: 98.5 F (36.9 C) (!) 97.5 F (36.4 C) (!) 97.5 F (36.4 C) (!) 97.5 F (36.4 C)  TempSrc: Oral Oral Oral Oral  SpO2: 97% 99% 97% 97%  Weight:      Height:       ROS negative except as per HPI  Physical exam: General: No acute distress, conversant, afebrile Neuro: Alert and oriented to self with improving orientation to location and condition Cardiovascular: RRR, no murmurs rubs or gallops auscultated Pulmonary: Bilateral lung fields are clear to auscultation without wheezing or crackles GI: Abdomen is soft, nontender, nondistended  Assessment/Plan:  Active Problems:   Recurrent seizures (HCC)   Essential hypertension   Hyperlipidemia  Assessment: Daniell Brookshireis a 84 year old malewith a PMHx notable for HLD, HTN, posterior circ stroke, possibleseizure versus stroke in 2017who presented with acute encephalopathy after being found down at his home. Hewas noted to be aphasic and incontinent of urine upon arrival. At baseline he is very independent performing his own ADLs and driving. Recent possible posterior circ stroke in 2017 vs seizure as per chart review. He was eventually placed on Keppra while at the hospital given the evaluation there. This was discontinued 1 month later as it was felt by his local  physician that the event was less related to seizure activity and more likely a stroke. The patient slowly improved during the current admission with some improvement by day 2 but continued to demonstrate possible focal seizure activity in his right distal lower extremity with EEG indicating possible left temporal dysfunction. The initial MRI failed to demonstrate anacute intracranial process. He was placed on Keppra with Vimpat added following the focal event. Neurology believe this to most likely be related to a prolonged postictal state and less likely a stroke.   Plan: Appreciate case social workers assistance with placement of the patient in a nursing facility given his physical limitations and expected prolonged recovery.  Acute encephalopathy: Patient continues to improve, we believe a prolonged postictal state secondary to a probable recurrent seizure.  He continues to improve daily as above. -Neurology continues to follow -Continue Vimpat and plan to transition from IV to p.o. today -Continue Keppra and plan to switch from IV to p.o. Today  HLD: As before. He remains unable to discuss the therapy with him. I will request that this be addresssed by his PCP upon follow-up as it is not urgently needed.  HTN: Overnight pressure's were well control and soft. He was hypertensive this am. Will continue to monitor and adjust his amlodipine up as indicated. --For now continue amlodipine 5mg  daily  Diet: Regular Code: DNR Fluids: n/a GI ppx: n/a VTE ppx: heparin Dispo: Anticipated discharge in approximately 1-2 day(s).   Lanelle BalHarbrecht, Keishana Klinger, MD 11/28/2017, 6:51 AM Pager: Pager# [phone]

## 2019-10-13 ENCOUNTER — Inpatient Hospital Stay (HOSPITAL_COMMUNITY)
Admission: EM | Admit: 2019-10-13 | Discharge: 2019-10-19 | DRG: 056 | Disposition: A | Discharge status: Skilled Nursing Facility | Payer: Medicare Other | Attending: Internal Medicine | Admitting: Internal Medicine

## 2019-10-13 ENCOUNTER — Encounter (HOSPITAL_COMMUNITY): Payer: Self-pay | Admitting: Radiology

## 2019-10-13 ENCOUNTER — Emergency Department (HOSPITAL_COMMUNITY): Payer: Medicare Other

## 2019-10-13 ENCOUNTER — Inpatient Hospital Stay (HOSPITAL_COMMUNITY): Payer: Medicare Other

## 2019-10-13 DIAGNOSIS — E785 Hyperlipidemia, unspecified: Secondary | ICD-10-CM | POA: Diagnosis present

## 2019-10-13 DIAGNOSIS — R4182 Altered mental status, unspecified: Secondary | ICD-10-CM | POA: Diagnosis not present

## 2019-10-13 DIAGNOSIS — I6523 Occlusion and stenosis of bilateral carotid arteries: Secondary | ICD-10-CM | POA: Diagnosis present

## 2019-10-13 DIAGNOSIS — I6389 Other cerebral infarction: Secondary | ICD-10-CM | POA: Diagnosis not present

## 2019-10-13 DIAGNOSIS — G9341 Metabolic encephalopathy: Secondary | ICD-10-CM

## 2019-10-13 DIAGNOSIS — E876 Hypokalemia: Secondary | ICD-10-CM | POA: Diagnosis present

## 2019-10-13 DIAGNOSIS — N1831 Chronic kidney disease, stage 3a: Secondary | ICD-10-CM | POA: Diagnosis present

## 2019-10-13 DIAGNOSIS — Z8673 Personal history of transient ischemic attack (TIA), and cerebral infarction without residual deficits: Secondary | ICD-10-CM

## 2019-10-13 DIAGNOSIS — R4701 Aphasia: Secondary | ICD-10-CM | POA: Diagnosis present

## 2019-10-13 DIAGNOSIS — I081 Rheumatic disorders of both mitral and tricuspid valves: Secondary | ICD-10-CM | POA: Diagnosis present

## 2019-10-13 DIAGNOSIS — R531 Weakness: Secondary | ICD-10-CM | POA: Diagnosis not present

## 2019-10-13 DIAGNOSIS — G8384 Todd's paralysis (postepileptic): Secondary | ICD-10-CM | POA: Diagnosis present

## 2019-10-13 DIAGNOSIS — Z87440 Personal history of urinary (tract) infections: Secondary | ICD-10-CM | POA: Diagnosis not present

## 2019-10-13 DIAGNOSIS — Z66 Do not resuscitate: Secondary | ICD-10-CM | POA: Diagnosis present

## 2019-10-13 DIAGNOSIS — R29810 Facial weakness: Secondary | ICD-10-CM | POA: Diagnosis not present

## 2019-10-13 DIAGNOSIS — G40909 Epilepsy, unspecified, not intractable, without status epilepticus: Secondary | ICD-10-CM | POA: Diagnosis present

## 2019-10-13 DIAGNOSIS — H4920 Sixth [abducent] nerve palsy, unspecified eye: Secondary | ICD-10-CM | POA: Diagnosis present

## 2019-10-13 DIAGNOSIS — G8191 Hemiplegia, unspecified affecting right dominant side: Secondary | ICD-10-CM | POA: Diagnosis present

## 2019-10-13 DIAGNOSIS — Z7982 Long term (current) use of aspirin: Secondary | ICD-10-CM | POA: Diagnosis not present

## 2019-10-13 DIAGNOSIS — I639 Cerebral infarction, unspecified: Secondary | ICD-10-CM | POA: Diagnosis present

## 2019-10-13 DIAGNOSIS — Z79899 Other long term (current) drug therapy: Secondary | ICD-10-CM

## 2019-10-13 DIAGNOSIS — G912 (Idiopathic) normal pressure hydrocephalus: Secondary | ICD-10-CM | POA: Diagnosis present

## 2019-10-13 DIAGNOSIS — I129 Hypertensive chronic kidney disease with stage 1 through stage 4 chronic kidney disease, or unspecified chronic kidney disease: Secondary | ICD-10-CM

## 2019-10-13 DIAGNOSIS — Z20822 Contact with and (suspected) exposure to covid-19: Secondary | ICD-10-CM | POA: Diagnosis present

## 2019-10-13 DIAGNOSIS — Z8249 Family history of ischemic heart disease and other diseases of the circulatory system: Secondary | ICD-10-CM | POA: Diagnosis not present

## 2019-10-13 DIAGNOSIS — N179 Acute kidney failure, unspecified: Secondary | ICD-10-CM | POA: Diagnosis not present

## 2019-10-13 DIAGNOSIS — R2990 Unspecified symptoms and signs involving the nervous system: Secondary | ICD-10-CM | POA: Diagnosis not present

## 2019-10-13 DIAGNOSIS — N182 Chronic kidney disease, stage 2 (mild): Secondary | ICD-10-CM | POA: Diagnosis not present

## 2019-10-13 DIAGNOSIS — R1310 Dysphagia, unspecified: Secondary | ICD-10-CM | POA: Diagnosis present

## 2019-10-13 DIAGNOSIS — R710 Precipitous drop in hematocrit: Secondary | ICD-10-CM | POA: Diagnosis not present

## 2019-10-13 DIAGNOSIS — R569 Unspecified convulsions: Secondary | ICD-10-CM | POA: Diagnosis not present

## 2019-10-13 HISTORY — DX: Occlusion and stenosis of bilateral carotid arteries: I65.23

## 2019-10-13 HISTORY — DX: Hyperlipidemia, unspecified: E78.5

## 2019-10-13 HISTORY — DX: Unspecified convulsions: R56.9

## 2019-10-13 HISTORY — DX: Dysphagia, unspecified: R13.10

## 2019-10-13 HISTORY — DX: Urinary tract infection, site not specified: N39.0

## 2019-10-13 LAB — PROTIME-INR
INR: 1.3 — ABNORMAL HIGH (ref 0.8–1.2)
Prothrombin Time: 15.9 seconds — ABNORMAL HIGH (ref 11.4–15.2)

## 2019-10-13 LAB — CBC
HCT: 41.4 % (ref 39.0–52.0)
Hemoglobin: 12.9 g/dL — ABNORMAL LOW (ref 13.0–17.0)
MCH: 28.8 pg (ref 26.0–34.0)
MCHC: 31.2 g/dL (ref 30.0–36.0)
MCV: 92.4 fL (ref 80.0–100.0)
Platelets: 127 10*3/uL — ABNORMAL LOW (ref 150–400)
RBC: 4.48 MIL/uL (ref 4.22–5.81)
RDW: 13.2 % (ref 11.5–15.5)
WBC: 12.7 10*3/uL — ABNORMAL HIGH (ref 4.0–10.5)
nRBC: 0 % (ref 0.0–0.2)

## 2019-10-13 LAB — APTT: aPTT: 25 seconds (ref 24–36)

## 2019-10-13 LAB — COMPREHENSIVE METABOLIC PANEL
ALT: 16 U/L (ref 0–44)
AST: 32 U/L (ref 15–41)
Albumin: 4.1 g/dL (ref 3.5–5.0)
Alkaline Phosphatase: 83 U/L (ref 38–126)
Anion gap: 17 — ABNORMAL HIGH (ref 5–15)
BUN: 18 mg/dL (ref 8–23)
CO2: 21 mmol/L — ABNORMAL LOW (ref 22–32)
Calcium: 9 mg/dL (ref 8.9–10.3)
Chloride: 104 mmol/L (ref 98–111)
Creatinine, Ser: 2.22 mg/dL — ABNORMAL HIGH (ref 0.61–1.24)
GFR calc Af Amer: 30 mL/min — ABNORMAL LOW (ref >60)
GFR calc non Af Amer: 26 mL/min — ABNORMAL LOW (ref >60)
Glucose, Bld: 136 mg/dL — ABNORMAL HIGH (ref 70–99)
Potassium: 4 mmol/L (ref 3.5–5.1)
Sodium: 142 mmol/L (ref 135–145)
Total Bilirubin: 0.8 mg/dL (ref 0.3–1.2)
Total Protein: 8.1 g/dL (ref 6.5–8.1)

## 2019-10-13 LAB — DIFFERENTIAL
Abs Immature Granulocytes: 0.05 10*3/uL (ref 0.00–0.07)
Basophils Absolute: 0 10*3/uL (ref 0.0–0.1)
Basophils Relative: 0 %
Eosinophils Absolute: 0 10*3/uL (ref 0.0–0.5)
Eosinophils Relative: 0 %
Immature Granulocytes: 0 %
Lymphocytes Relative: 5 %
Lymphs Abs: 0.7 10*3/uL (ref 0.7–4.0)
Monocytes Absolute: 1 10*3/uL (ref 0.1–1.0)
Monocytes Relative: 8 %
Neutro Abs: 10.9 10*3/uL — ABNORMAL HIGH (ref 1.7–7.7)
Neutrophils Relative %: 87 %

## 2019-10-13 LAB — CBG MONITORING, ED: Glucose-Capillary: 121 mg/dL — ABNORMAL HIGH (ref 70–99)

## 2019-10-13 LAB — RESPIRATORY PANEL BY RT PCR (FLU A&B, COVID)
Influenza A by PCR: NEGATIVE
Influenza B by PCR: NEGATIVE
SARS Coronavirus 2 by RT PCR: NEGATIVE

## 2019-10-13 LAB — I-STAT CHEM 8, ED
BUN: 18 mg/dL (ref 8–23)
Calcium, Ion: 1.05 mmol/L — ABNORMAL LOW (ref 1.15–1.40)
Chloride: 108 mmol/L (ref 98–111)
Creatinine, Ser: 2.1 mg/dL — ABNORMAL HIGH (ref 0.61–1.24)
Glucose, Bld: 131 mg/dL — ABNORMAL HIGH (ref 70–99)
HCT: 39 % (ref 39.0–52.0)
Hemoglobin: 13.3 g/dL (ref 13.0–17.0)
Potassium: 4 mmol/L (ref 3.5–5.1)
Sodium: 143 mmol/L (ref 135–145)
TCO2: 22 mmol/L (ref 22–32)

## 2019-10-13 LAB — VALPROIC ACID LEVEL: Valproic Acid Lvl: 112 ug/mL — ABNORMAL HIGH (ref 50.0–100.0)

## 2019-10-13 MED ORDER — LEVETIRACETAM IN NACL 1000 MG/100ML IV SOLN
1000.0000 mg | INTRAVENOUS | Status: AC
  Administered 2019-10-13: 11:00:00 1000 mg via INTRAVENOUS
  Filled 2019-10-13: qty 100

## 2019-10-13 MED ORDER — ACETAMINOPHEN 325 MG PO TABS: 650.0000 mg | ORAL_TABLET | ORAL | Status: DC | PRN

## 2019-10-13 MED ORDER — SODIUM CHLORIDE 0.9% FLUSH
3.0000 mL | Freq: Once | INTRAVENOUS | Status: AC
  Administered 2019-10-13: 10:00:00 3 mL via INTRAVENOUS

## 2019-10-13 MED ORDER — ASPIRIN EC 81 MG PO TBEC
81.0000 mg | DELAYED_RELEASE_TABLET | Freq: Every day | ORAL | Status: DC
  Administered 2019-10-16 – 2019-10-19 (×4): 81 mg via ORAL
  Filled 2019-10-13 (×5): qty 1

## 2019-10-13 MED ORDER — SENNOSIDES-DOCUSATE SODIUM 8.6-50 MG PO TABS
1.0000 | ORAL_TABLET | Freq: Every evening | ORAL | Status: DC | PRN
  Administered 2019-10-18: 09:00:00 1 via ORAL
  Filled 2019-10-13 (×2): qty 1

## 2019-10-13 MED ORDER — DIVALPROEX SODIUM ER 500 MG PO TB24
1000.0000 mg | ORAL_TABLET | Freq: Every day | ORAL | Status: DC
  Administered 2019-10-16: 09:00:00 1000 mg via ORAL
  Filled 2019-10-13 (×2): qty 2

## 2019-10-13 MED ORDER — IOHEXOL 350 MG/ML SOLN
100.0000 mL | Freq: Once | INTRAVENOUS | Status: AC | PRN
  Administered 2019-10-13: 09:00:00 100 mL via INTRAVENOUS

## 2019-10-13 MED ORDER — ATORVASTATIN CALCIUM 40 MG PO TABS: 40.0000 mg | ORAL_TABLET | Freq: Every day | ORAL | Status: DC

## 2019-10-13 MED ORDER — SODIUM CHLORIDE 0.9 % IV SOLN: INTRAVENOUS | Status: AC

## 2019-10-13 MED ORDER — STROKE: EARLY STAGES OF RECOVERY BOOK
Freq: Once | Status: DC
  Filled 2019-10-13 (×2): qty 1

## 2019-10-13 MED ORDER — ACETAMINOPHEN 650 MG RE SUPP: 650.0000 mg | RECTAL | Status: DC | PRN

## 2019-10-13 MED ORDER — HEPARIN SODIUM (PORCINE) 5000 UNIT/ML IJ SOLN
5000.0000 [IU] | Freq: Two times a day (BID) | INTRAMUSCULAR | Status: DC
  Administered 2019-10-13 – 2019-10-17 (×9): 5000 [IU] via SUBCUTANEOUS
  Filled 2019-10-13 (×9): qty 1

## 2019-10-13 MED ORDER — ACETAMINOPHEN 160 MG/5ML PO SOLN: 650.0000 mg | ORAL | Status: DC | PRN

## 2019-10-13 NOTE — ED Notes (Signed)
Patient attempting to climb out of stretcher.  Pulled off EEG and cardiac monitor cables.  Patient urinated in stretcher.  Diaper changed.  Position changed.  Patient redirected.  Laying comfortably at this time.

## 2019-10-13 NOTE — ED Triage Notes (Signed)
Pt arrives to ED via [facility] as a code stroke met by code stroke team. Pt was Last seen normal by daughter last night at 1830 and found in this morning in his recliner with left sided gauze and no movement in right arm or leg. Pt also appears to have mouth trauma with dried blood on lips. Pt bought back from CT to room after being cleared by Neuro after CTP.

## 2019-10-13 NOTE — ED Notes (Signed)
Patient getting EEG completed at this time

## 2019-10-13 NOTE — H&P (Addendum)
History and Physical    Jalil Lorusso EQA:[id number] DOB: Sep 12, 1930 DOA: 10/13/2019  PCP: Mauricia Area, MD   Patient coming from: Home  I have personally briefly reviewed patient's old medical records in [facility] Link  Chief Complaint: AMS  HPI: Kinley Ferrentino is a 86 y.o. male with medical history significant of remote stroke more than 10 years ago, hypertension, hyperlipidemia, bilateral carotid stenosis presented with right-sided weakness.    Patient daughter who lives with patient reported that patient was last seen normal at 1830 on 10/12/2019.    This morning, she noticed that he had a left gaze preference and was not moving his right side, and called 911.  Patient has altered mental status nonverbal, most history was provided by patient's daughter and ED record.   According to patient daughter, patient received Cipro last week for UTI by PCP.  ED Course: Blood pressure was 128/80, heart rate 100-110s, CBG 128 and patient was nonvocal.  Patient continued to have left gaze preference, nonverbal with right-sided weakness.  He was immediately brought to CT scan for, CT of head, CTA head neck and perfusion.  In addition stat EEG was ordered to evaluate for possible status epilepticus.  Received 1 dose of IV Keppra.  Review of Systems: Unable to perform patient nonverbal  Past Medical History:  Diagnosis Date   Bilateral carotid artery stenosis    Dysphagia    Hyperlipidemia    Hypertension    Stroke [facility])     No past surgical history on file.   has no history on file for tobacco, alcohol, and drug.  No Known Allergies  Family History  Problem Relation Age of Onset   Hypertension Mother    Hypertension Father      Prior to Admission medications   Medication Sig Start Date End Date Taking? Authorizing Provider  amLODipine (NORVASC) 5 MG tablet Take 1 tablet (5 mg total) by mouth daily. 11/29/17  Yes Lanelle Bal, MD  aspirin EC 81 MG tablet Take  81 mg by mouth daily.   Yes [provider]  atorvastatin (LIPITOR) 40 MG tablet Take 40 mg by mouth at bedtime. 07/28/19  Yes [provider]  divalproex (DEPAKOTE ER) 500 MG 24 hr tablet Take 1,000 mg by mouth daily.  10/01/19  Yes [provider]  Lacosamide 100 MG TABS Take 1 tablet (100 mg total) by mouth every 12 (twelve) hours. Patient not taking: Reported on 10/13/2019 11/28/17   Lanelle Bal, MD    Physical Exam: Vitals:   10/13/19 1130 10/13/19 1145 10/13/19 1230 10/13/19 1245  BP: 110/69 (!) 147/76 113/78 (!) 147/90  Pulse: 80 92 95 (!) 103  Resp: 15 16 20 20   SpO2: 93% 93% 92% 96%    Constitutional: NAD, calm, comfortable Vitals:   10/13/19 1130 10/13/19 1145 10/13/19 1230 10/13/19 1245  BP: 110/69 (!) 147/76 113/78 (!) 147/90  Pulse: 80 92 95 (!) 103  Resp: 15 16 20 20   SpO2: 93% 93% 92% 96%   Eyes: PERRL, lids and conjunctivae normal ENMT: Mucous membranes are dry. Posterior pharynx clear of any exudate or lesions.Normal dentition.  Neck: normal, supple, no masses, no thyromegaly Respiratory: clear to auscultation bilaterally, no wheezing, no crackles. Normal respiratory effort. No accessory muscle use.  Cardiovascular: Regular rate and rhythm, no murmurs / rubs / gallops. No extremity edema. 2+ pedal pulses. No carotid bruits.  Abdomen: no tenderness, no masses palpated. No hepatosplenomegaly. Bowel sounds positive.  Musculoskeletal: no clubbing / cyanosis.  No joint deformity upper and lower extremities. Good ROM, no contractures. Normal muscle tone.  Skin: no rashes, lesions, ulcers. No induration Neurologic: Left gaze palsy, right facial droop, right-sided weakness, not following command Psychiatric: Somewhat on rest, nonverbal not following commands.   (  Labs on Admission: I have personally reviewed following labs and imaging studies  CBC: Recent Labs  Lab 10/13/19 0856 10/13/19 0900  WBC 12.7*  --   NEUTROABS 10.9*  --     HGB 12.9* 13.3  HCT 41.4 39.0  MCV 92.4  --   PLT 127*  --    Basic Metabolic Panel: Recent Labs  Lab 10/13/19 0856 10/13/19 0900  NA 142 143  K 4.0 4.0  CL 104 108  CO2 21*  --   GLUCOSE 136* 131*  BUN 18 18  CREATININE 2.22* 2.10*  CALCIUM 9.0  --    GFR: CrCl cannot be calculated (Unknown ideal weight.). Liver Function Tests: Recent Labs  Lab 10/13/19 0856  AST 32  ALT 16  ALKPHOS 83  BILITOT 0.8  PROT 8.1  ALBUMIN 4.1   No results for input(s): LIPASE, AMYLASE in the last 168 hours. No results for input(s): AMMONIA in the last 168 hours. Coagulation Profile: Recent Labs  Lab 10/13/19 0856  INR 1.3*   Cardiac Enzymes: No results for input(s): CKTOTAL, CKMB, CKMBINDEX, TROPONINI in the last 168 hours. BNP (last 3 results) No results for input(s): PROBNP in the last 8760 hours. HbA1C: No results for input(s): HGBA1C in the last 72 hours. CBG: Recent Labs  Lab 10/13/19 0855  GLUCAP 121*   Lipid Profile: No results for input(s): CHOL, HDL, LDLCALC, TRIG, CHOLHDL, LDLDIRECT in the last 72 hours. Thyroid Function Tests: No results for input(s): TSH, T4TOTAL, FREET4, T3FREE, THYROIDAB in the last 72 hours. Anemia Panel: No results for input(s): VITAMINB12, FOLATE, FERRITIN, TIBC, IRON, RETICCTPCT in the last 72 hours. Urine analysis:    Component Value Date/Time   COLORURINE YELLOW 11/24/2017 0852   APPEARANCEUR CLEAR 11/24/2017 0852   LABSPEC 1.045 (H) 11/24/2017 0852   PHURINE 6.0 11/24/2017 0852   GLUCOSEU NEGATIVE 11/24/2017 0852   HGBUR LARGE (A) 11/24/2017 0852   BILIRUBINUR NEGATIVE 11/24/2017 0852   KETONESUR NEGATIVE 11/24/2017 0852   PROTEINUR >=300 (A) 11/24/2017 0852   NITRITE NEGATIVE 11/24/2017 0852   LEUKOCYTESUR NEGATIVE 11/24/2017 0852    Radiological Exams on Admission: EEG  Result Date: 10/13/2019 Charlsie Quest, MD     10/13/2019 12:51 PM Patient Name: Kelcie Loo MRN: [id number] Epilepsy Attending: Charlsie Quest Referring Physician/Provider: [name], PA Date: 10/13/2019 Duration: 23.12 minutes Patient history: 86 year old male presented as a stroke alert with left-sided gaze preference, nonverbal and right-sided weakness.  EEG to evaluate for status epilepticus. Level of alertness: Lethargic AEDs during EEG study: None Technical aspects: This EEG study was done with scalp electrodes positioned according to the 10-20 International system of electrode placement. Electrical activity was acquired at a sampling rate of 500Hz  and reviewed with a high frequency filter of 70Hz  and a low frequency filter of 1Hz . EEG data were recorded continuously and digitally stored. Description: No clear posterior dominant was seen.  EEG showed continuous generalized 3 to 5 Hz theta-delta slowing.  Sharp waves were seen in left frontotemporal region, maximal F7. Hyperventilation and photic stimulation were not performed. Abnormality -Continuous slow, generalized -Sharp wave, left frontotemporal region IMPRESSION: This study showed evidence of epileptogenic city in the left frontotemporal region.  Additionally there is evidence of  moderate diffuse encephalopathy, nonspecific etiology. No seizures were seen throughout the recording.   CT ANGIO HEAD W OR WO CONTRAST  Result Date: 10/13/2019 CLINICAL DATA:  Right-sided weakness. Leftward gaze. Last seen normal last night. EXAM: CT ANGIOGRAPHY HEAD AND NECK CT PERFUSION BRAIN TECHNIQUE: Multidetector CT imaging of the head and neck was performed using the standard protocol during bolus administration of intravenous contrast. Multiplanar CT image reconstructions and MIPs were obtained to evaluate the vascular anatomy. Carotid stenosis measurements (when applicable) are obtained utilizing NASCET criteria, using the distal internal carotid diameter as the denominator. Multiphase CT imaging of the brain was performed following IV bolus contrast injection. Subsequent parametric perfusion maps  were calculated using RAPID software. CONTRAST:  100mL OMNIPAQUE IOHEXOL 350 MG/ML SOLN COMPARISON:  CT head without contrast of the same day. CT head without contrast 08/21/2019. MR head without contrast 03/17/2019. CTA of the head and neck [facility]/2019 [facility]. FINDINGS: CTA NECK FINDINGS Aortic arch: Atherosclerotic changes are again noted in the aortic arch. Origins the great vessels are incompletely imaged. Right carotid system: The right common carotid artery demonstrates some distal atherosclerotic change. Atherosclerotic changes are present at the bifurcation, similar the prior study. No significant stenosis is present. Cervical right ICA is otherwise within normal limits. Progress Left carotid system: The left common carotid artery demonstrate scattered mural atherosclerotic changes. Mixed atherosclerotic changes are again noted at the bifurcation. Posterior ulceration is stable. No significant stenosis is present. The cervical left ICA is otherwise normal. Vertebral arteries: The right vertebral artery is the dominant vessel. Moderate to high-grade stenosis is again noted at the non dominant left vertebral artery origin. There is no significant stenosis or occlusion of either vertebral artery in the neck. Skeleton: Degenerative changes of the cervical spine are stable, most evident at C4-5, C5-6, and C6-7. No focal lytic or blastic lesions are present. Other neck: Soft tissues the neck are otherwise unremarkable. Upper chest: Centrilobular emphysematous changes are again noted. No focal nodule, mass, or airspace disease is present. Thoracic inlet is within normal limits. Review of the MIP images confirms the above findings CTA HEAD FINDINGS Anterior circulation: Atherosclerotic changes are again noted within the cavernous internal carotid arteries bilaterally. No significant stenosis is present through the ICA terminus. The A1 and M1 segments are normal. MCA bifurcations are intact.  The MCA and ACA branch vessels are within normal limits. Posterior circulation: The right vertebral artery is the dominant vessel. Dominant AICA vessels are present bilaterally. The basilar artery is normal. Both posterior cerebral arteries originate from basilar tip. PCA branch vessels are within normal limits. Venous sinuses: The dural sinuses are patent. The right transverse sinus is dominant. The straight sinus and deep cerebral veins are intact. Cortical veins are unremarkable. Anatomic variants: None Review of the MIP images confirms the above findings CT Brain Perfusion Findings: ASPECTS: 9/10 CBF (<30%) Volume: 0mL Perfusion (Tmax>6.0s) volume: 0mL Mismatch Volume: 0mL Infarction Location:None by CT perfusion. The area of concern on the noncontrast head CT is above the level imaged. IMPRESSION: 1. No emergent large vessel occlusion. 2. CT perfusion demonstrates no significant core infarct or penumbra. 3. Stable atherosclerotic changes involving the carotid bifurcations bilaterally, left greater than right. No significant stenosis is present. 4. Stable atherosclerotic changes involving the cavernous internal carotid arteries bilaterally without significant stenosis. 5. High-grade stenosis at the origin of the non dominant left vertebral artery. Electronically Signed   By: Marin Robertshristopher  Mattern M.D.   On: 10/13/2019 09:29  CT ANGIO NECK W OR WO CONTRAST  Result Date: 10/13/2019 CLINICAL DATA:  Right-sided weakness. Leftward gaze. Last seen normal last night. EXAM: CT ANGIOGRAPHY HEAD AND NECK CT PERFUSION BRAIN TECHNIQUE: Multidetector CT imaging of the head and neck was performed using the standard protocol during bolus administration of intravenous contrast. Multiplanar CT image reconstructions and MIPs were obtained to evaluate the vascular anatomy. Carotid stenosis measurements (when applicable) are obtained utilizing NASCET criteria, using the distal internal carotid diameter as the denominator.  Multiphase CT imaging of the brain was performed following IV bolus contrast injection. Subsequent parametric perfusion maps were calculated using RAPID software. CONTRAST:  OMNIPAQUE IOHEXOL 350 MG/ML SOLN COMPARISON:  CT head without contrast of the same day. CT head without contrast 08/21/2019. MR head without contrast 03/17/2019. CTA of the head and neck 03/16/2019 at [facility] [place]. FINDINGS: CTA NECK FINDINGS Aortic arch: Atherosclerotic changes are again noted in the aortic arch. Origins the great vessels are incompletely imaged. Right carotid system: The right common carotid artery demonstrates some distal atherosclerotic change. Atherosclerotic changes are present at the bifurcation, similar the prior study. No significant stenosis is present. Cervical right ICA is otherwise within normal limits. Progress Left carotid system: The left common carotid artery demonstrate scattered mural atherosclerotic changes. Mixed atherosclerotic changes are again noted at the bifurcation. Posterior ulceration is stable. No significant stenosis is present. The cervical left ICA is otherwise normal. Vertebral arteries: The right vertebral artery is the dominant vessel. Moderate to high-grade stenosis is again noted at the non dominant left vertebral artery origin. There is no significant stenosis or occlusion of either vertebral artery in the neck. Skeleton: Degenerative changes of the cervical spine are stable, most evident at C4-5, C5-6, and C6-7. No focal lytic or blastic lesions are present. Other neck: Soft tissues the neck are otherwise unremarkable. Upper chest: Centrilobular emphysematous changes are again noted. No focal nodule, mass, or airspace disease is present. Thoracic inlet is within normal limits. Review of the MIP images confirms the above findings CTA HEAD FINDINGS Anterior circulation: Atherosclerotic changes are again noted within the cavernous internal carotid arteries bilaterally.  No significant stenosis is present through the ICA terminus. The A1 and M1 segments are normal. MCA bifurcations are intact. The MCA and ACA branch vessels are within normal limits. Posterior circulation: The right vertebral artery is the dominant vessel. Dominant AICA vessels are present bilaterally. The basilar artery is normal. Both posterior cerebral arteries originate from basilar tip. PCA branch vessels are within normal limits. Venous sinuses: The dural sinuses are patent. The right transverse sinus is dominant. The straight sinus and deep cerebral veins are intact. Cortical veins are unremarkable. Anatomic variants: None Review of the MIP images confirms the above findings CT Brain Perfusion Findings: ASPECTS: 9/10 CBF (<30%) Volume: 47mL Perfusion (Tmax>6.0s) volume: 30mL Mismatch Volume: 35mL Infarction Location:None by CT perfusion. The area of concern on the noncontrast head CT is above the level imaged. IMPRESSION: 1. No emergent large vessel occlusion. 2. CT perfusion demonstrates no significant core infarct or penumbra. 3. Stable atherosclerotic changes involving the carotid bifurcations bilaterally, left greater than right. No significant stenosis is present. 4. Stable atherosclerotic changes involving the cavernous internal carotid arteries bilaterally without significant stenosis. 5. High-grade stenosis at the origin of the non dominant left vertebral artery. Electronically Signed   By: Marin Roberts M.D.   On: 10/13/2019 09:29   CT CEREBRAL PERFUSION W CONTRAST  Result Date: 10/13/2019 CLINICAL DATA:  Right-sided weakness.  Leftward gaze. Last seen normal last night. EXAM: CT ANGIOGRAPHY HEAD AND NECK CT PERFUSION BRAIN TECHNIQUE: Multidetector CT imaging of the head and neck was performed using the standard protocol during bolus administration of intravenous contrast. Multiplanar CT image reconstructions and MIPs were obtained to evaluate the vascular anatomy. Carotid stenosis measurements  (when applicable) are obtained utilizing NASCET criteria, using the distal internal carotid diameter as the denominator. Multiphase CT imaging of the brain was performed following IV bolus contrast injection. Subsequent parametric perfusion maps were calculated using RAPID software. CONTRAST:  152mL OMNIPAQUE IOHEXOL 350 MG/ML SOLN COMPARISON:  CT head without contrast of the same day. CT head without contrast 08/21/2019. MR head without contrast 03/17/2019. CTA of the head and neck 03/16/2019 at [facility]. FINDINGS: CTA NECK FINDINGS Aortic arch: Atherosclerotic changes are again noted in the aortic arch. Origins the great vessels are incompletely imaged. Right carotid system: The right common carotid artery demonstrates some distal atherosclerotic change. Atherosclerotic changes are present at the bifurcation, similar the prior study. No significant stenosis is present. Cervical right ICA is otherwise within normal limits. Progress Left carotid system: The left common carotid artery demonstrate scattered mural atherosclerotic changes. Mixed atherosclerotic changes are again noted at the bifurcation. Posterior ulceration is stable. No significant stenosis is present. The cervical left ICA is otherwise normal. Vertebral arteries: The right vertebral artery is the dominant vessel. Moderate to high-grade stenosis is again noted at the non dominant left vertebral artery origin. There is no significant stenosis or occlusion of either vertebral artery in the neck. Skeleton: Degenerative changes of the cervical spine are stable, most evident at C4-5, C5-6, and C6-7. No focal lytic or blastic lesions are present. Other neck: Soft tissues the neck are otherwise unremarkable. Upper chest: Centrilobular emphysematous changes are again noted. No focal nodule, mass, or airspace disease is present. Thoracic inlet is within normal limits. Review of the MIP images confirms the above findings CTA HEAD FINDINGS  Anterior circulation: Atherosclerotic changes are again noted within the cavernous internal carotid arteries bilaterally. No significant stenosis is present through the ICA terminus. The A1 and M1 segments are normal. MCA bifurcations are intact. The MCA and ACA branch vessels are within normal limits. Posterior circulation: The right vertebral artery is the dominant vessel. Dominant AICA vessels are present bilaterally. The basilar artery is normal. Both posterior cerebral arteries originate from basilar tip. PCA branch vessels are within normal limits. Venous sinuses: The dural sinuses are patent. The right transverse sinus is dominant. The straight sinus and deep cerebral veins are intact. Cortical veins are unremarkable. Anatomic variants: None Review of the MIP images confirms the above findings CT Brain Perfusion Findings: ASPECTS: 9/10 CBF (<30%) Volume: 83mL Perfusion (Tmax>6.0s) volume: 55mL Mismatch Volume: 26mL Infarction Location:None by CT perfusion. The area of concern on the noncontrast head CT is above the level imaged. IMPRESSION: 1. No emergent large vessel occlusion. 2. CT perfusion demonstrates no significant core infarct or penumbra. 3. Stable atherosclerotic changes involving the carotid bifurcations bilaterally, left greater than right. No significant stenosis is present. 4. Stable atherosclerotic changes involving the cavernous internal carotid arteries bilaterally without significant stenosis. 5. High-grade stenosis at the origin of the non dominant left vertebral artery. Electronically Signed   By: San Morelle M.D.   On: 10/13/2019 09:29   CT HEAD CODE STROKE WO CONTRAST  Result Date: 10/13/2019 CLINICAL DATA:  Code stroke. Focal neuro deficit for greater than 6 hours. Right arm and leg weakness. Left-sided gaze  deficit. EXAM: CT HEAD WITHOUT CONTRAST TECHNIQUE: Contiguous axial images were obtained from the base of the skull through the vertex without intravenous contrast.  COMPARISON:  CT head without contrast 08/21/2019 at [facility]. MRI of the head without contrast 03/17/2019 at [facility] FINDINGS: Brain: Advanced atrophy and white matter disease is present. There is focal loss of gray-white differentiation in the precentral gyrus near the vertex. No acute hemorrhage or mass lesion is present. The ventricles are proportionate to the degree of atrophy. A remote lacunar infarct is present in the left lentiform nucleus. Basal ganglia are otherwise intact. Insular ribbon is normal bilaterally. Calcifications in the left sylvian fissure are stable. Vascular: Mild atherosclerotic changes are present without a hyperdense vessel. Skull: Insert normal skull No significant extracranial soft tissue lesion is present. Sinuses/Orbits: The paranasal sinuses and mastoid air cells are clear. Bilateral lens replacements are noted. Globes and orbits are otherwise unremarkable. ASPECTS [facility] [place] [facility] Stroke Program Early CT Score) - Ganglionic level infarction (caudate, lentiform nuclei, internal capsule, insula, M1-M3 cortex): 6/7 - Supraganglionic infarction (M4-M6 cortex): 3/3 Total score (0-10 with 10 being normal): 9/10 IMPRESSION: 1. Focal loss of gray-white differentiation in the left precentral gyrus near the vertex. 2. No other focal cortical or basal ganglia acute abnormality. 3. Advanced atrophy and diffuse white matter disease likely reflects the sequela of chronic microvascular ischemia. 4. ASPECTS is 10/10 The above was relayed via text pager to Dr. Amada Jupiter on 10/13/2019 at 09:13 . Electronically Signed   By: Marin Roberts M.D.   On: 10/13/2019 09:13    EKG: Independently reviewed.   Assessment/Plan Active Problems:   Stroke (cerebrum) (HCC)   CVA (cerebral vascular accident) (HCC)  CVA With right-sided weakness and right facial droop and VI nerve palsy on the right side Hx of bilateral carotid stenosis However his age bracket need to rule out other  etiologies such as amyloidosis Spring and statin for now MRI, echo ordered This was discussed with patient's daughter in detail, all questions answered at my best knowledge. PT evaluation and speech evaluation expect to discharge to SNF versus rehab once stroke work-up done Allow permissive hypertension for today and tomorrow  Acute metabolic encephalopathy Secondary to stroke versus breakthrough seizure Seizure precaution  Question of breakthrough seizure with baseline seizure disorder According to patient daughter, his seizure disorder  has been stable, suspecting etiology may be related to recent use of quinolones for UTI? Depakote level checked, received 1 dose of Keppra in the ED  AKI on CKD stage II Aware patient received IV contrast today, will order slow hydration for today and recheck BMP in the morning.   DVT prophylaxis: Heparin subcu Code Status: DNR confirmed with patient daughter Family Communication: Daughter over the phone Disposition Plan: SNF versus rehab Consults called: Neurology on board Admission status: Telemetry admission   Emeline General MD [facility] Pager [phone]    10/13/2019, 1:32 PM

## 2019-10-13 NOTE — ED Notes (Signed)
PT to MRI so NIH done before going to MRI

## 2019-10-13 NOTE — Procedures (Signed)
Echo attempted. Patient not in room in ED.

## 2019-10-13 NOTE — Procedures (Signed)
Patient Name: Seth Madden  MRN: [id number]  Epilepsy Attending: Charlsie Quest  Referring Physician/Provider: [name], PA Date: 10/13/2019 Duration: 23.12 minutes  Patient history: 86 year old male presented as a stroke alert with left-sided gaze preference, nonverbal and right-sided weakness.  EEG to evaluate for status epilepticus.  Level of alertness: Lethargic  AEDs during EEG study: None  Technical aspects: This EEG study was done with scalp electrodes positioned according to the 10-20 International system of electrode placement. Electrical activity was acquired at a sampling rate of 500Hz  and reviewed with a high frequency filter of 70Hz  and a low frequency filter of 1Hz . EEG data were recorded continuously and digitally stored.   Description: No clear posterior dominant was seen.  EEG showed continuous generalized 3 to 5 Hz theta-delta slowing.  Sharp waves were seen in left frontotemporal region, maximal F7. Hyperventilation and photic stimulation were not performed.  Abnormality -Continuous slow, generalized -Sharp wave, left frontotemporal region  IMPRESSION: This study showed evidence of epileptogenic city in the left frontotemporal region.  Additionally there is evidence of moderate diffuse encephalopathy, nonspecific etiology. No seizures were seen throughout the recording.

## 2019-10-13 NOTE — Code Documentation (Signed)
86 yo male coming from home where he lives by himself. Pt was LKW at [place] yesterday by his daughter. This morning at 0800, pt's daughter came over to check on him when she noted he was in his chair with left sided gaze not responding to her. EMS was called and activated a Code Stroke. Stroke Team met patient upon arrival to the ED. Labs drawn and EDP cleared airway. Pt taken directly to CT. CT Head showed ASPECTS 9. CTA/CTP completed and had no evidence of LVO per MD Leonel Ramsay. Pt is not a tPA candidate due to being outside the window. Pt taken back to ED placed on the cardiac monitor. STAT EEG ordered. Handoff given to Janett Billow, Therapist, sports.

## 2019-10-13 NOTE — Consult Note (Addendum)
Neurology Consultation  Reason for Consult: Code stroke Referring Physician: Lennice Sites, DO  CC: Nonverbal, left-sided preference, right-sided flaccidity  History is obtained from: EMS as I was unable to get in contact with daughter  HPI: Seth Madden is a 86 y.o. male with past medical history of stroke, hypertension, hyperlipidemia, bilateral carotid stenosis.  Per EMS patient was last seen normal at 1830 on 10/12/2019 by daughter.  Daughter came back to check on father.  At that time she noticed that he had a left gaze preference and was not moving his right side.  At that time she called 911.  On arrival EMS stated that the blood pressure was 128/80, heart rate 112, CBG 128 and patient was nonvocal.  Patient was brought immediately to the hospital as a code stroke.  On arrival patient continued to have left gaze preference, nonverbal with right-sided weakness.  He was immediately brought to CT scan for, CT of head, CTA head neck and perfusion.  In addition stat EEG was ordered to evaluate for possible status epilepticus.  Of note: As below it should be noted that patient's anti-epileptic medications for changed on 10/01/2019 in which Keppra was DC'd and patient was placed on Depakote only  ED course  Relevant labs include -creatinine 2.10, glucose 131, calcium 1.05 CT head shows-CT head, CTA head and neck, CT perfusion to evaluate for possible stroke.  CT of head showed focal loss of gray-white differentiation in the left precentral gyrus near the vertex.  No other focal cortical or basal ganglia acute abnormality.  Still awaiting CTA and CTP.  Chart review: 1.  Patient was seen back on 11/24/2017.  At that time patient was noted to have a seizure with suspected initially status epilepticus.  Patient presented with right-sided weakness, aphasia and left gaze preference.   Over the course of hospitalization patient improved. Patient was started on Keppra 750 mg twice daily along with  Vimpat 100 mg twice daily.   2.  Patient recently was seen at [facility] neurology on 10/01/2019.  At that time daughter was concerned because he was having episodes of encephalopathy some of them with question of seizures.  On their note it states that patient was changed to Depakote 500 mg 24-hour tablet by mouth daily.   LKW: [id number] on 10/12/2019 tpa given?: no, out of window Premorbid modified Rankin scale (mRS): 0 NIHSS Score:    Past Medical History:  Diagnosis Date  . Bilateral carotid artery stenosis   . Dysphagia   . Hyperlipidemia   . Hypertension   . Stroke [facility])     Family History  Problem Relation Age of Onset  . Hypertension Mother   . Hypertension Father     Social History:  Patient does not smoke, does not use alcohol, does not use illicit drugs.  Medications  Current Facility-Administered Medications:  .  sodium chloride flush (NS) 0.9 % injection 3 mL, 3 mL, Intravenous, Once, Curatolo, Adam, DO  Current Outpatient Medications:  .  amLODipine (NORVASC) 5 MG tablet, Take 1 tablet (5 mg total) by mouth daily., Disp: 30 tablet, Rfl: 2 .  aspirin EC 81 MG tablet, Take 81 mg by mouth daily., Disp: , Rfl:  .  Lacosamide 100 MG TABS, Take 1 tablet (100 mg total) by mouth every 12 (twelve) hours., Disp: 60 tablet, Rfl: 1 .  levETIRAcetam (KEPPRA) 750 MG tablet, Take 1 tablet (750 mg total) by mouth every 12 (twelve) hours., Disp: 60 tablet, Rfl:  1  ROS:   Unable to obtain due to patient being nonvocal.   Exam: Current vital signs: Vitals:   10/13/19 0945 10/13/19 1015  BP: 116/79 107/69  Pulse: 95 87  Resp: 16 16  SpO2: 93% 92%    Vital signs in last 24 hours:     Constitutional: Appears well-developed and well-nourished.  Psych: Affect appropriate to situation Eyes: No scleral injection HENT: He has blood at the corner of his mouth, despite repeated attempts to assess the tongue, the patient clenches down resisting me checking  and I am worried that further attempts at assessment could be dangerous. Head: Normocephalic.  Cardiovascular: Normal rate and regular rhythm.  Respiratory: Effort normal, non-labored breathing GI: Soft.  No distension. There is no tenderness.  Skin: WDI  Neuro: Mental Status: Patient is awake, alert, densely aphasic Cranial Nerves: II: Does not blink to the right III,IV, VI: He has a leftward gaze, does not cross midline to the right VII: Facial movement with right facial weakness Motor: He has flaccid right hemiparesis with minimal movement to noxious stimulation, normal movement on the left Sensory: He does respond to noxious stimulation on the right but less than the left Cerebellar: Does not perform    Labs I have reviewed labs in epic and the results pertinent to this consultation are:  CBC    Component Value Date/Time   WBC 12.7 (H) 10/13/2019 0856   RBC 4.48 10/13/2019 0856   HGB 13.3 10/13/2019 0900   HCT 39.0 10/13/2019 0900   PLT 127 (L) 10/13/2019 0856   MCV 92.4 10/13/2019 0856   MCH 28.8 10/13/2019 0856   MCHC 31.2 10/13/2019 0856   RDW 13.2 10/13/2019 0856   LYMPHSABS 0.7 10/13/2019 0856   MONOABS 1.0 10/13/2019 0856   EOSABS 0.0 10/13/2019 0856   BASOSABS 0.0 10/13/2019 0856    CMP     Component Value Date/Time   NA 143 10/13/2019 0900   K 4.0 10/13/2019 0900   CL 108 10/13/2019 0900   CO2 24 11/27/2017 0324   GLUCOSE 131 (H) 10/13/2019 0900   BUN 18 10/13/2019 0900   CREATININE 2.10 (H) 10/13/2019 0900   CALCIUM 8.2 (L) 11/27/2017 0324   PROT 8.4 (H) 11/24/2017 0850   ALBUMIN 4.2 11/24/2017 0850   AST 37 11/24/2017 0850   ALT 17 11/24/2017 0850   ALKPHOS 86 11/24/2017 0850   BILITOT 1.2 11/24/2017 0850   GFRNONAA >60 11/27/2017 0324   GFRAA >60 11/27/2017 0324    Lipid Panel     Component Value Date/Time   CHOL 252 (H) 11/24/2017 0850   TRIG 89 11/24/2017 0850   HDL 51 11/24/2017 0850   CHOLHDL 4.9 11/24/2017 0850   VLDL 18  11/24/2017 0850   LDLCALC 183 (H) 11/24/2017 0850     Imaging I have reviewed the images obtained: CTA/CTP-negative  CT-scan of the brain-focal loss of gray-white differentiation in the left precentral gyrus near the vertex  [name] PA-C [facility] [phone]  M-F  (9:00 am- 5:00 PM)  10/13/2019, 9:24 AM     Assessment:  86 year old male with a history of seizures presenting with right hemiplegia and aphasia without clear cause on vascular imaging.  It is possible that he had an embolic shower causing an MCA syndrome without large vessel occlusion, but I would also be concerned that he possibly is postictal with a Todd's phenomenon.  Interestingly, he had a similar presentation a couple of years ago with negative MRI, raising seizure  as a possibility  He has had multiple medication changes including starting cipro which lowers seizure threshold.   Recommendations: 1) stat EEG 2) if EEG is negative for ongoing seizure activity, would obtain a stat MRI. 3) if MRI does not clearly explain his current exam, then I would perform prolonged overnight EEG 4) Depakote level, will consider load if low 5) continue home Vimpat 100 twice daily 6) Keppra 1 g x 1 7) Neurology will follow.   [name], MD [facility] [phone]  If 7pm- 7am, please page neurology on call as listed in AMION. 10/13/2019  10:33 AM

## 2019-10-13 NOTE — Procedures (Signed)
Echo attempted again. EEG tech stated patient was uncooperative and "swinging" at them. Will attempt again when patient is more cooperative.

## 2019-10-13 NOTE — ED Notes (Signed)
EEG at bedside.

## 2019-10-13 NOTE — Progress Notes (Signed)
vLTM EEG started. New leads used for patient. Pt was agitated/ difficult hookup

## 2019-10-13 NOTE — ED Provider Notes (Signed)
[facility] EMERGENCY DEPARTMENT Provider Note   CSN: [id number] Arrival date & time: 10/13/19  7867  An emergency department physician performed an initial assessment on this suspected stroke patient at 15.  History Chief Complaint  Patient presents with  . Code Stroke    Connell Magness is a 86 y.o. male.  Patient last seen normal at 1830 last night.  Patient with history of seizures, stroke who presents with EMS as a code stroke with left gaze preference, not moving much of the right side.  Opens eyes spontaneously, mumbles and appears to withdraw to pain.  Blood sugar normal with EMS.  Recently started on antibiotics for UTI.  Nothing has made it worse or better.  Follows with neurology at [facility].  Recent changes to seizure medications per chart review.  The history is provided by the EMS personnel.  Altered Mental Status Presenting symptoms: partial responsiveness   Severity:  Mild Episode history:  Single Timing:  Constant Progression:  Unchanged Chronicity:  New      Past Medical History:  Diagnosis Date  . Bilateral carotid artery stenosis   . Dysphagia   . Hyperlipidemia   . Hypertension   . Stroke [facility])     Patient Active Problem List   Diagnosis Date Noted  . Essential hypertension 11/25/2017  . Hyperlipidemia 11/25/2017  . Recurrent seizures (HCC) 11/24/2017    No past surgical history on file.     Family History  Problem Relation Age of Onset  . Hypertension Mother   . Hypertension Father     Social History   Tobacco Use  . Smoking status: Not on file  Substance Use Topics  . Alcohol use: Not on file  . Drug use: Not on file    Home Medications Prior to Admission medications   Medication Sig Start Date End Date Taking? Authorizing Provider  amLODipine (NORVASC) 5 MG tablet Take 1 tablet (5 mg total) by mouth daily. 11/29/17  Yes Lanelle Bal, MD  aspirin EC 81 MG tablet Take 81 mg by mouth daily.   Yes  [provider]  atorvastatin (LIPITOR) 40 MG tablet Take 40 mg by mouth at bedtime. 07/28/19  Yes [provider]  divalproex (DEPAKOTE ER) 500 MG 24 hr tablet Take 2,000 mg by mouth daily. 10/01/19  Yes [provider]  Lacosamide 100 MG TABS Take 1 tablet (100 mg total) by mouth every 12 (twelve) hours. Patient not taking: Reported on 10/13/2019 11/28/17   Lanelle Bal, MD    Allergies    Patient has no known allergies.  Review of Systems   Review of Systems  Unable to perform ROS: Mental status change    Physical Exam Updated Vital Signs  ED Triage Vitals [10/13/19 0921]  Enc Vitals Group     BP (!) 164/81     Pulse Rate (!) 111     Resp (!) 23     Temp      Temp src      SpO2 95 %     Weight      Height      Head Circumference      Peak Flow      Pain Score      Pain Loc      Pain Edu?      Excl. in GC?     Physical Exam Vitals and nursing note reviewed.  Constitutional:      General: He is not in acute distress.  Appearance: He is well-developed. He is ill-appearing.  HENT:     Head: Normocephalic and atraumatic.     Nose: Nose normal.     Mouth/Throat:     Mouth: Mucous membranes are moist.  Eyes:     Conjunctiva/sclera: Conjunctivae normal.     Pupils: Pupils are equal, round, and reactive to light.     Comments: Left eye gaze  Cardiovascular:     Rate and Rhythm: Normal rate and regular rhythm.     Pulses: Normal pulses.     Heart sounds: Normal heart sounds. No murmur.  Pulmonary:     Effort: Pulmonary effort is normal. No respiratory distress.     Breath sounds: Normal breath sounds.  Abdominal:     General: There is no distension.     Palpations: Abdomen is soft.     Tenderness: There is no abdominal tenderness.  Musculoskeletal:        General: Normal range of motion.     Cervical back: Normal range of motion and neck supple.  Skin:    General: Skin is warm and dry.  Neurological:     Mental Status: He is  alert.     Comments: Left gaze preference, does not appear to be moving the right side as much but does appear to withdraw to pain in all 4 extremities.  Patient does move left side extremities without any issues.  Mumbled speech, equal and reactive pupils     ED Results / Procedures / Treatments   Labs (all labs ordered are listed, but only abnormal results are displayed) Labs Reviewed  PROTIME-INR - Abnormal; Notable for the following components:      Result Value   Prothrombin Time 15.9 (*)    INR 1.3 (*)    All other components within normal limits  CBC - Abnormal; Notable for the following components:   WBC 12.7 (*)    Hemoglobin 12.9 (*)    Platelets 127 (*)    All other components within normal limits  DIFFERENTIAL - Abnormal; Notable for the following components:   Neutro Abs 10.9 (*)    All other components within normal limits  COMPREHENSIVE METABOLIC PANEL - Abnormal; Notable for the following components:   CO2 21 (*)    Glucose, Bld 136 (*)    Creatinine, Ser 2.22 (*)    GFR calc non Af Amer 26 (*)    GFR calc Af Amer 30 (*)    Anion gap 17 (*)    All other components within normal limits  I-STAT CHEM 8, ED - Abnormal; Notable for the following components:   Creatinine, Ser 2.10 (*)    Glucose, Bld 131 (*)    Calcium, Ion 1.05 (*)    All other components within normal limits  CBG MONITORING, ED - Abnormal; Notable for the following components:   Glucose-Capillary 121 (*)    All other components within normal limits  RESPIRATORY PANEL BY RT PCR (FLU A&B, COVID)  URINE CULTURE  APTT  URINALYSIS, ROUTINE W REFLEX MICROSCOPIC  VALPROIC ACID LEVEL  LEVETIRACETAM LEVEL    EKG EKG Interpretation  Date/Time:  Tuesday October 13 2019 09:26:17 EST Ventricular Rate:  108 PR Interval:    QRS Duration: 105 QT Interval:  350 QTC Calculation: 470 R Axis:   102 Text Interpretation: Sinus tachycardia Right axis deviation Low voltage, precordial leads Consider  anterior infarct Confirmed by Virgina NorfolkAdam, Jerelle Virden [phone]([id number]) on 10/13/2019 9:27:22 AM   Radiology CT ANGIO HEAD W OR  WO CONTRAST  Result Date: 10/13/2019 CLINICAL DATA:  Right-sided weakness. Leftward gaze. Last seen normal last night. EXAM: CT ANGIOGRAPHY HEAD AND NECK CT PERFUSION BRAIN TECHNIQUE: Multidetector CT imaging of the head and neck was performed using the standard protocol during bolus administration of intravenous contrast. Multiplanar CT image reconstructions and MIPs were obtained to evaluate the vascular anatomy. Carotid stenosis measurements (when applicable) are obtained utilizing NASCET criteria, using the distal internal carotid diameter as the denominator. Multiphase CT imaging of the brain was performed following IV bolus contrast injection. Subsequent parametric perfusion maps were calculated using RAPID software. CONTRAST:  OMNIPAQUE IOHEXOL 350 MG/ML SOLN COMPARISON:  CT head without contrast of the same day. CT head without contrast 08/21/2019. MR head without contrast 03/17/2019. CTA of the head and neck 03/16/2019 at [facility]. FINDINGS: CTA NECK FINDINGS Aortic arch: Atherosclerotic changes are again noted in the aortic arch. Origins the great vessels are incompletely imaged. Right carotid system: The right common carotid artery demonstrates some distal atherosclerotic change. Atherosclerotic changes are present at the bifurcation, similar the prior study. No significant stenosis is present. Cervical right ICA is otherwise within normal limits. Progress Left carotid system: The left common carotid artery demonstrate scattered mural atherosclerotic changes. Mixed atherosclerotic changes are again noted at the bifurcation. Posterior ulceration is stable. No significant stenosis is present. The cervical left ICA is otherwise normal. Vertebral arteries: The right vertebral artery is the dominant vessel. Moderate to high-grade stenosis is again noted at the non dominant  left vertebral artery origin. There is no significant stenosis or occlusion of either vertebral artery in the neck. Skeleton: Degenerative changes of the cervical spine are stable, most evident at C4-5, C5-6, and C6-7. No focal lytic or blastic lesions are present. Other neck: Soft tissues the neck are otherwise unremarkable. Upper chest: Centrilobular emphysematous changes are again noted. No focal nodule, mass, or airspace disease is present. Thoracic inlet is within normal limits. Review of the MIP images confirms the above findings CTA HEAD FINDINGS Anterior circulation: Atherosclerotic changes are again noted within the cavernous internal carotid arteries bilaterally. No significant stenosis is present through the ICA terminus. The A1 and M1 segments are normal. MCA bifurcations are intact. The MCA and ACA branch vessels are within normal limits. Posterior circulation: The right vertebral artery is the dominant vessel. Dominant AICA vessels are present bilaterally. The basilar artery is normal. Both posterior cerebral arteries originate from basilar tip. PCA branch vessels are within normal limits. Venous sinuses: The dural sinuses are patent. The right transverse sinus is dominant. The straight sinus and deep cerebral veins are intact. Cortical veins are unremarkable. Anatomic variants: None Review of the MIP images confirms the above findings CT Brain Perfusion Findings: ASPECTS: 9/10 CBF (<30%) Volume: 97mL Perfusion (Tmax>6.0s) volume: 44mL Mismatch Volume: 57mL Infarction Location:None by CT perfusion. The area of concern on the noncontrast head CT is above the level imaged. IMPRESSION: 1. No emergent large vessel occlusion. 2. CT perfusion demonstrates no significant core infarct or penumbra. 3. Stable atherosclerotic changes involving the carotid bifurcations bilaterally, left greater than right. No significant stenosis is present. 4. Stable atherosclerotic changes involving the cavernous internal carotid  arteries bilaterally without significant stenosis. 5. High-grade stenosis at the origin of the non dominant left vertebral artery. Electronically Signed   By: Marin Roberts M.D.   On: 10/13/2019 09:29   CT ANGIO NECK W OR WO CONTRAST  Result Date: 10/13/2019 CLINICAL DATA:  Right-sided weakness. Leftward gaze. Last  seen normal last night. EXAM: CT ANGIOGRAPHY HEAD AND NECK CT PERFUSION BRAIN TECHNIQUE: Multidetector CT imaging of the head and neck was performed using the standard protocol during bolus administration of intravenous contrast. Multiplanar CT image reconstructions and MIPs were obtained to evaluate the vascular anatomy. Carotid stenosis measurements (when applicable) are obtained utilizing NASCET criteria, using the distal internal carotid diameter as the denominator. Multiphase CT imaging of the brain was performed following IV bolus contrast injection. Subsequent parametric perfusion maps were calculated using RAPID software. CONTRAST:  139mL OMNIPAQUE IOHEXOL 350 MG/ML SOLN COMPARISON:  CT head without contrast of the same day. CT head without contrast 08/21/2019. MR head without contrast 03/17/2019. CTA of the head and neck 03/16/2019 at [facility]. FINDINGS: CTA NECK FINDINGS Aortic arch: Atherosclerotic changes are again noted in the aortic arch. Origins the great vessels are incompletely imaged. Right carotid system: The right common carotid artery demonstrates some distal atherosclerotic change. Atherosclerotic changes are present at the bifurcation, similar the prior study. No significant stenosis is present. Cervical right ICA is otherwise within normal limits. Progress Left carotid system: The left common carotid artery demonstrate scattered mural atherosclerotic changes. Mixed atherosclerotic changes are again noted at the bifurcation. Posterior ulceration is stable. No significant stenosis is present. The cervical left ICA is otherwise normal. Vertebral  arteries: The right vertebral artery is the dominant vessel. Moderate to high-grade stenosis is again noted at the non dominant left vertebral artery origin. There is no significant stenosis or occlusion of either vertebral artery in the neck. Skeleton: Degenerative changes of the cervical spine are stable, most evident at C4-5, C5-6, and C6-7. No focal lytic or blastic lesions are present. Other neck: Soft tissues the neck are otherwise unremarkable. Upper chest: Centrilobular emphysematous changes are again noted. No focal nodule, mass, or airspace disease is present. Thoracic inlet is within normal limits. Review of the MIP images confirms the above findings CTA HEAD FINDINGS Anterior circulation: Atherosclerotic changes are again noted within the cavernous internal carotid arteries bilaterally. No significant stenosis is present through the ICA terminus. The A1 and M1 segments are normal. MCA bifurcations are intact. The MCA and ACA branch vessels are within normal limits. Posterior circulation: The right vertebral artery is the dominant vessel. Dominant AICA vessels are present bilaterally. The basilar artery is normal. Both posterior cerebral arteries originate from basilar tip. PCA branch vessels are within normal limits. Venous sinuses: The dural sinuses are patent. The right transverse sinus is dominant. The straight sinus and deep cerebral veins are intact. Cortical veins are unremarkable. Anatomic variants: None Review of the MIP images confirms the above findings CT Brain Perfusion Findings: ASPECTS: 9/10 CBF (<30%) Volume: 29mL Perfusion (Tmax>6.0s) volume: 89mL Mismatch Volume: 7mL Infarction Location:None by CT perfusion. The area of concern on the noncontrast head CT is above the level imaged. IMPRESSION: 1. No emergent large vessel occlusion. 2. CT perfusion demonstrates no significant core infarct or penumbra. 3. Stable atherosclerotic changes involving the carotid bifurcations bilaterally, left  greater than right. No significant stenosis is present. 4. Stable atherosclerotic changes involving the cavernous internal carotid arteries bilaterally without significant stenosis. 5. High-grade stenosis at the origin of the non dominant left vertebral artery. Electronically Signed   By: San Morelle M.D.   On: 10/13/2019 09:29   CT CEREBRAL PERFUSION W CONTRAST  Result Date: 10/13/2019 CLINICAL DATA:  Right-sided weakness. Leftward gaze. Last seen normal last night. EXAM: CT ANGIOGRAPHY HEAD AND NECK CT PERFUSION BRAIN TECHNIQUE: Multidetector CT  imaging of the head and neck was performed using the standard protocol during bolus administration of intravenous contrast. Multiplanar CT image reconstructions and MIPs were obtained to evaluate the vascular anatomy. Carotid stenosis measurements (when applicable) are obtained utilizing NASCET criteria, using the distal internal carotid diameter as the denominator. Multiphase CT imaging of the brain was performed following IV bolus contrast injection. Subsequent parametric perfusion maps were calculated using RAPID software. CONTRAST:  OMNIPAQUE IOHEXOL 350 MG/ML SOLN COMPARISON:  CT head without contrast of the same day. CT head without contrast 08/21/2019. MR head without contrast 03/17/2019. CTA of the head and neck 03/16/2019 at [facility]. FINDINGS: CTA NECK FINDINGS Aortic arch: Atherosclerotic changes are again noted in the aortic arch. Origins the great vessels are incompletely imaged. Right carotid system: The right common carotid artery demonstrates some distal atherosclerotic change. Atherosclerotic changes are present at the bifurcation, similar the prior study. No significant stenosis is present. Cervical right ICA is otherwise within normal limits. Progress Left carotid system: The left common carotid artery demonstrate scattered mural atherosclerotic changes. Mixed atherosclerotic changes are again noted at the  bifurcation. Posterior ulceration is stable. No significant stenosis is present. The cervical left ICA is otherwise normal. Vertebral arteries: The right vertebral artery is the dominant vessel. Moderate to high-grade stenosis is again noted at the non dominant left vertebral artery origin. There is no significant stenosis or occlusion of either vertebral artery in the neck. Skeleton: Degenerative changes of the cervical spine are stable, most evident at C4-5, C5-6, and C6-7. No focal lytic or blastic lesions are present. Other neck: Soft tissues the neck are otherwise unremarkable. Upper chest: Centrilobular emphysematous changes are again noted. No focal nodule, mass, or airspace disease is present. Thoracic inlet is within normal limits. Review of the MIP images confirms the above findings CTA HEAD FINDINGS Anterior circulation: Atherosclerotic changes are again noted within the cavernous internal carotid arteries bilaterally. No significant stenosis is present through the ICA terminus. The A1 and M1 segments are normal. MCA bifurcations are intact. The MCA and ACA branch vessels are within normal limits. Posterior circulation: The right vertebral artery is the dominant vessel. Dominant AICA vessels are present bilaterally. The basilar artery is normal. Both posterior cerebral arteries originate from basilar tip. PCA branch vessels are within normal limits. Venous sinuses: The dural sinuses are patent. The right transverse sinus is dominant. The straight sinus and deep cerebral veins are intact. Cortical veins are unremarkable. Anatomic variants: None Review of the MIP images confirms the above findings CT Brain Perfusion Findings: ASPECTS: 9/10 CBF (<30%) Volume: 45mL Perfusion (Tmax>6.0s) volume: 34mL Mismatch Volume: 78mL Infarction Location:None by CT perfusion. The area of concern on the noncontrast head CT is above the level imaged. IMPRESSION: 1. No emergent large vessel occlusion. 2. CT perfusion  demonstrates no significant core infarct or penumbra. 3. Stable atherosclerotic changes involving the carotid bifurcations bilaterally, left greater than right. No significant stenosis is present. 4. Stable atherosclerotic changes involving the cavernous internal carotid arteries bilaterally without significant stenosis. 5. High-grade stenosis at the origin of the non dominant left vertebral artery. Electronically Signed   By: Marin Roberts M.D.   On: 10/13/2019 09:29   CT HEAD CODE STROKE WO CONTRAST  Result Date: 10/13/2019 CLINICAL DATA:  Code stroke. Focal neuro deficit for greater than 6 hours. Right arm and leg weakness. Left-sided gaze deficit. EXAM: CT HEAD WITHOUT CONTRAST TECHNIQUE: Contiguous axial images were obtained from the base of the skull through  the vertex without intravenous contrast. COMPARISON:  CT head without contrast [facility]21/2019 [facility]. MRI of the head without contrast [facility]/17/2019 [facility]. FINDINGS: Brain: Advanced atrophy and white matter disease is present. There is focal loss of gray-white differentiation in the precentral gyrus near the vertex. No acute hemorrhage or mass lesion is present. The ventricles are proportionate to the degree of atrophy. A remote lacunar infarct is present in the left lentiform nucleus. Basal ganglia are otherwise intact. Insular ribbon is normal bilaterally. Calcifications in the left sylvian fissure are stable. Vascular: Mild atherosclerotic changes are present without a hyperdense vessel. Skull: Insert normal skull No significant extracranial soft tissue lesion is present. Sinuses/Orbits: The paranasal sinuses and mastoid air cells are clear. Bilateral lens replacements are noted. Globes and orbits are otherwise unremarkable. ASPECTS [facility]([place] Stroke Program Early CT Score) - Ganglionic level infarction (caudate, lentiform nuclei, internal capsule, insula, M1-M3 cortex): 6/7 - Supraganglionic infarction (M4-M6 cortex):  3/3 Total score (0-10 with 10 being normal): 9/10 IMPRESSION: 1. Focal loss of gray-white differentiation in the left precentral gyrus near the vertex. 2. No other focal cortical or basal ganglia acute abnormality. 3. Advanced atrophy and diffuse white matter disease likely reflects the sequela of chronic microvascular ischemia. 4. ASPECTS is 10/10 The above was relayed via text pager to Dr. Amada [email or web address] on 10/13/2019 at 09:13 . Electronically Signed   By: Marin Robertshristopher  Mattern M.D.   On: 10/13/2019 09:13    Procedures .Critical Care Performed by: Virgina Norfolkuratolo, Melvyn Hommes, DO Authorized by: Virgina Norfolkuratolo, Arbell Wycoff, DO   Critical care provider statement:    Critical care time (minutes):  35   Critical care was necessary to treat or prevent imminent or life-threatening deterioration of the following conditions:  CNS failure or compromise   Critical care was time spent personally by me on the following activities:  Blood draw for specimens, development of treatment plan with patient or surrogate, discussions with consultants, discussions with primary provider, evaluation of patient's response to treatment, examination of patient, obtaining history from patient or surrogate, ordering and performing treatments and interventions, ordering and review of laboratory studies, ordering and review of radiographic studies, pulse oximetry, re-evaluation of patient's condition and review of old charts   I assumed direction of critical care for this patient from another provider in my specialty: no     (including critical care time)  Medications Ordered in ED Medications  sodium chloride flush (NS) 0.9 % injection 3 mL (3 mLs Intravenous Given 10/13/19 1022)  iohexol (OMNIPAQUE) 350 MG/ML injection 100 mL (100 mLs Intravenous Contrast Given 10/13/19 0915)  levETIRAcetam (KEPPRA) IVPB 1000 mg/100 mL premix (1,000 mg Intravenous New Bag/Given 10/13/19 1047)    ED Course  I have reviewed the triage vital signs and the nursing  notes.  Pertinent labs & imaging results that were available during my care of the patient were reviewed by me and considered in my medical decision making (see chart for details).    MDM Rules/Calculators/A&P  Jorge Nyherian Delight H[place]ok[place] is an 86 year old male history of seizure, stroke who presents to the ED as a code stroke.  Last known normal last night around 1830.  Family found patient with left side gaze preference.  Recent visit to neurology office and recent change in seizure medications as well as being treated for urinary tract infection with Cipro per chart review.  Patient appears to be moving the left extremities more than the right has left gaze preference, mumbled speech.  Neurology at the bedside upon  arrival.  Patient went directly to the CT scan.  There is questionable focal loss of gray-white differentiation in the left precentral gyrus.  Possibly stroke.  However neurology will order stat EEG to evaluate for any signs of seizures.  Patient clinically does not appear to have major tonic-clonic seizing.  Lab work to be obtained.  EKG shows sinus tachycardia.  Following EEG will dictate further treatment and admission.  Patient outside the window for TPA and patient also not a candidate for IR intervention.  No major large vessel occlusion.  EEG does not show status epilepticus but there are some epileptic signals on EEG.  Patient to be admitted for further stroke work-up.  Suspect symptoms today are from a stroke.  MRI has been ordered.  Neurology following along.  Patient has been given Keppra.  Patient with stable neuro exam.  Awake and open eyes spontaneously, moving left extremities better than right extremity still and appears to have left-sided preference still.  This chart was dictated using voice recognition software.  Despite best efforts to proofread,  errors can occur which can change the documentation meaning.    Final Clinical Impression(s) / ED Diagnoses Final diagnoses:   Stroke-like symptoms  Altered mental status, unspecified altered mental status type    Rx / DC Orders ED Discharge Orders    None       Virgina Norfolk, DO 10/13/19 1123

## 2019-10-13 NOTE — Progress Notes (Signed)
EEG completed, results pending. 

## 2019-10-14 ENCOUNTER — Inpatient Hospital Stay (HOSPITAL_COMMUNITY): Payer: Medicare Other

## 2019-10-14 ENCOUNTER — Other Ambulatory Visit: Payer: Self-pay

## 2019-10-14 ENCOUNTER — Encounter (HOSPITAL_COMMUNITY): Payer: PRIVATE HEALTH INSURANCE

## 2019-10-14 ENCOUNTER — Encounter (HOSPITAL_COMMUNITY): Payer: Self-pay | Admitting: Internal Medicine

## 2019-10-14 DIAGNOSIS — I6389 Other cerebral infarction: Secondary | ICD-10-CM

## 2019-10-14 DIAGNOSIS — R531 Weakness: Secondary | ICD-10-CM

## 2019-10-14 DIAGNOSIS — R569 Unspecified convulsions: Secondary | ICD-10-CM

## 2019-10-14 DIAGNOSIS — R4182 Altered mental status, unspecified: Secondary | ICD-10-CM

## 2019-10-14 LAB — BASIC METABOLIC PANEL
Anion gap: 14 (ref 5–15)
BUN: 19 mg/dL (ref 8–23)
CO2: 24 mmol/L (ref 22–32)
Calcium: 8.8 mg/dL — ABNORMAL LOW (ref 8.9–10.3)
Chloride: 108 mmol/L (ref 98–111)
Creatinine, Ser: 1.54 mg/dL — ABNORMAL HIGH (ref 0.61–1.24)
GFR calc Af Amer: 47 mL/min — ABNORMAL LOW (ref >60)
GFR calc non Af Amer: 40 mL/min — ABNORMAL LOW (ref >60)
Glucose, Bld: 94 mg/dL (ref 70–99)
Potassium: 3.7 mmol/L (ref 3.5–5.1)
Sodium: 146 mmol/L — ABNORMAL HIGH (ref 135–145)

## 2019-10-14 LAB — LIPID PANEL
Cholesterol: 118 mg/dL (ref 0–200)
HDL: 55 mg/dL (ref >40)
LDL Cholesterol: 46 mg/dL (ref 0–99)
Total CHOL/HDL Ratio: 2.1 RATIO
Triglycerides: 87 mg/dL (ref <150)
VLDL: 17 mg/dL (ref 0–40)

## 2019-10-14 LAB — CBC WITH DIFFERENTIAL/PLATELET
Abs Immature Granulocytes: 0.03 10*3/uL (ref 0.00–0.07)
Basophils Absolute: 0 10*3/uL (ref 0.0–0.1)
Basophils Relative: 0 %
Eosinophils Absolute: 0 10*3/uL (ref 0.0–0.5)
Eosinophils Relative: 0 %
HCT: 38 % — ABNORMAL LOW (ref 39.0–52.0)
Hemoglobin: 12.4 g/dL — ABNORMAL LOW (ref 13.0–17.0)
Immature Granulocytes: 0 %
Lymphocytes Relative: 10 %
Lymphs Abs: 0.9 10*3/uL (ref 0.7–4.0)
MCH: 29.3 pg (ref 26.0–34.0)
MCHC: 32.6 g/dL (ref 30.0–36.0)
MCV: 89.8 fL (ref 80.0–100.0)
Monocytes Absolute: 0.9 10*3/uL (ref 0.1–1.0)
Monocytes Relative: 11 %
Neutro Abs: 6.8 10*3/uL (ref 1.7–7.7)
Neutrophils Relative %: 79 %
Platelets: 126 10*3/uL — ABNORMAL LOW (ref 150–400)
RBC: 4.23 MIL/uL (ref 4.22–5.81)
RDW: 13.3 % (ref 11.5–15.5)
WBC: 8.6 10*3/uL (ref 4.0–10.5)
nRBC: 0 % (ref 0.0–0.2)

## 2019-10-14 LAB — ECHOCARDIOGRAM COMPLETE
Height: 68 in
Weight: 1887.14 oz

## 2019-10-14 LAB — URINALYSIS, ROUTINE W REFLEX MICROSCOPIC
Bacteria, UA: NONE SEEN
Bilirubin Urine: NEGATIVE
Glucose, UA: NEGATIVE mg/dL
Ketones, ur: 5 mg/dL — AB
Leukocytes,Ua: NEGATIVE
Nitrite: NEGATIVE
Protein, ur: 100 mg/dL — AB
RBC / HPF: >50 RBC/hpf — ABNORMAL HIGH (ref 0–5)
Specific Gravity, Urine: 1.032 — ABNORMAL HIGH (ref 1.005–1.030)
pH: 6 (ref 5.0–8.0)

## 2019-10-14 LAB — HEMOGLOBIN A1C
Hgb A1c MFr Bld: 5.7 % — ABNORMAL HIGH (ref 4.8–5.6)
Mean Plasma Glucose: 116.89 mg/dL

## 2019-10-14 LAB — VALPROIC ACID LEVEL: Valproic Acid Lvl: 60 ug/mL (ref 50.0–100.0)

## 2019-10-14 MED ORDER — SODIUM CHLORIDE 0.9 % IV SOLN
100.0000 mg | Freq: Two times a day (BID) | INTRAVENOUS | Status: DC
  Administered 2019-10-14 – 2019-10-16 (×5): 100 mg via INTRAVENOUS
  Filled 2019-10-14 (×6): qty 10

## 2019-10-14 MED ORDER — LORAZEPAM 2 MG/ML IJ SOLN
1.0000 mg | Freq: Once | INTRAMUSCULAR | Status: AC
  Administered 2019-10-14: 10:00:00 1 mg via INTRAVENOUS
  Filled 2019-10-14: qty 1

## 2019-10-14 MED ORDER — LORAZEPAM 2 MG/ML IJ SOLN
0.2500 mg | Freq: Once | INTRAMUSCULAR | Status: AC
  Administered 2019-10-14: 0.25 mg via INTRAVENOUS
  Filled 2019-10-14: qty 1

## 2019-10-14 NOTE — Plan of Care (Signed)
Pt admitted from ED. Vitals stable on RA. Pt alert, appears oriented only to self, but difficult to assess to due minimal verbalizations and pronounced dysarthia and aphasia. IV fluids and telemetry monitoring started. Dried, dark blood present on oral mucosa, oral care rendered but unable to clean adequately as pt tries shuts lips tight or bites on swabs. Seizure precautions started. Pt very impulsive and tries to dangle and get out of bed frequently using his L arm. R arm and leg remain weak and purposeful movement noted on all 4 limbs. Safety mitts applied on L hand for now for pt safety. Assisted with incontinence needs several times during the night. Condom catheter replaced this AM, urine culture sample sent. Will monitor accordingly.  Problem: Safety: Goal: Verbalization of understanding the information provided will improve 10/14/2019 0612 by Roetta Sessions, RN Outcome: Progressing 10/14/2019 0611 by Roetta Sessions, RN Outcome: Progressing   Problem: Clinical Measurements: Goal: Complications related to the disease process, condition or treatment will be avoided or minimized 10/14/2019 0612 by Roetta Sessions, RN Outcome: Progressing 10/14/2019 0611 by Roetta Sessions, RN Outcome: Progressing  Problem: Skin Integrity: Goal: Risk for impaired skin integrity will decrease 10/14/2019 0612 by Roetta Sessions, RN Outcome: Progressing 10/14/2019 0611 by Roetta Sessions, RN Outcome: Progressing

## 2019-10-14 NOTE — Evaluation (Signed)
Occupational Therapy Evaluation Patient Details Name: Seth Madden MRN: [id number] DOB: November 02, 1930 Today's Date: 10/14/2019    History of Present Illness Seth Madden is a 86 y.o. male with medical history significant CVA, HTN, HLD, seizure, bilateral carotid stenosis presented with right-sided weakness and AMS. MRI with no acute intracranial abnormality although limited study due to motion artifact. CTA negative for emergent LVO. Work up pending for possible breakthrough seizure/status epilepticus.    Clinical Impression   Patient presenting with decreased I in self care, balance, functional mobility/transfers, cognition, R inattention, unable to follow commands, and safety awareness. No family present to determine baseline but he was living at home with daughter per chart. Patient currently functioning at max +2 assistance for mobility and total A for self care tasks. Patient will benefit from acute OT to increase overall independence in the areas of ADLs, functional mobility, and safety awareness in order to safely discharge to next venue of care.    Follow Up Recommendations  SNF;Supervision/Assistance - 24 hour    Equipment Recommendations  Other (comment)(defer to next venue of care)       Precautions / Restrictions Precautions Precautions: Fall Restrictions Weight Bearing Restrictions: No      Mobility Bed Mobility Overal bed mobility: Needs Assistance Bed Mobility: Supine to Sit;Sit to Supine     Supine to sit: Max assist;+2 for physical assistance Sit to supine: Max assist;+2 for physical assistance   General bed mobility comments: MaxA + 2 for initiation and execution of supine <> sit   Transfers Overall transfer level: Needs assistance Equipment used: 2 person hand held assist Transfers: Sit to/from Stand Sit to Stand: Mod assist;+2 physical assistance         General transfer comment: ModA + 2 to stand, pt with crouched posture including increased  hip/knee flexion, heavy retropulsion and unable to correct    Balance Overall balance assessment: Needs assistance Sitting-balance support: Feet supported Sitting balance-Leahy Scale: Fair Sitting balance - Comments: Supervision for safety   Standing balance support: Bilateral upper extremity supported Standing balance-Leahy Scale: Poor Standing balance comment: reliant on external support          ADL either performed or assessed with clinical judgement   ADL Overall ADL's : Needs assistance/impaired     Grooming: Wash/dry hands;Wash/dry face Grooming Details (indicate cue type and reason): hand over hand and once grasping cloth with L hand he was unable to release Upper Body Bathing: Maximal assistance;Sitting   Lower Body Bathing: Total assistance;Sit to/from stand   Upper Body Dressing : Sitting;Total assistance Upper Body Dressing Details (indicate cue type and reason): hospital gown Lower Body Dressing: Total assistance;Sit to/from stand        General ADL Comments: pt unable to follow 1 step commands this session     Vision   Additional Comments: unable to formally assess secondary to cognitive deficits            Pertinent Vitals/Pain Pain Assessment: Faces Faces Pain Scale: No hurt     Hand Dominance Right   Extremity/Trunk Assessment Upper Extremity Assessment Upper Extremity Assessment: Difficult to assess due to impaired cognition(Pt did move digits and elbow but not on command and not functionally. Pt with inattention R UE)   Lower Extremity Assessment Lower Extremity Assessment: Difficult to assess due to impaired cognition;RLE deficits/detail RLE Deficits / Details: At least anti gravity; no knee buckle in standing       Communication Communication Communication: Expressive difficulties   Cognition Arousal/Alertness: Awake/alert Behavior  During Therapy: Flat affect Overall Cognitive Status: Impaired/Different from baseline Area of  Impairment: Attention;Following commands;Awareness      Current Attention Level: Focused   Following Commands: Follows one step commands inconsistently   Awareness: Intellectual   General Comments: Pt with minimal command following, perseverating on, "don't kill me," and "I'm not scary." Decreased attention, awareness              Home Living Family/patient expects to be discharged to:: Private residence Living Arrangements: Children(daughter)        Additional Comments: Pt unable to provide home living; per chart review, lives with his daughter      Prior Functioning/Environment Level of Independence: Needs assistance        Comments: Pt unable to provide PLOF        OT Problem List: Decreased strength;Decreased coordination;Pain;Decreased activity tolerance;Decreased safety awareness;Impaired balance (sitting and/or standing);Decreased cognition;Decreased knowledge of use of DME or AE;Impaired UE functional use      OT Treatment/Interventions: Self-care/ADL training;Therapeutic exercise;Therapeutic activities;Energy conservation;DME and/or AE instruction;Balance training;Patient/family education;Cognitive remediation/compensation    OT Goals(Current goals can be found in the care plan section) Acute Rehab OT Goals Patient Stated Goal: unable OT Goal Formulation: Patient unable to participate in goal setting ADL Goals Pt Will Perform Grooming: with min assist Pt Will Perform Upper Body Bathing: with min assist Pt Will Perform Lower Body Bathing: with mod assist Pt Will Perform Upper Body Dressing: with min assist Pt Will Perform Lower Body Dressing: with mod assist Pt Will Transfer to Toilet: with mod assist Pt Will Perform Toileting - Clothing Manipulation and hygiene: with mod assist  OT Frequency: Min 2X/week   Barriers to D/C:    unknown at this time       Co-evaluation PT/OT/SLP Co-Evaluation/Treatment: Yes Reason for Co-Treatment: Complexity of the  patient's impairments (multi-system involvement);Necessary to address cognition/behavior during functional activity;For patient/therapist safety;To address functional/ADL transfers PT goals addressed during session: Mobility/safety with mobility OT goals addressed during session: ADL's and self-care      AM-PAC OT "6 Clicks" Daily Activity     Outcome Measure Help from another person eating meals?: Total Help from another person taking care of personal grooming?: Total Help from another person toileting, which includes using toliet, bedpan, or urinal?: Total Help from another person bathing (including washing, rinsing, drying)?: Total Help from another person to put on and taking off regular upper body clothing?: Total Help from another person to put on and taking off regular lower body clothing?: Total 6 Click Score: 6   End of Session Equipment Utilized During Treatment: Gait belt Nurse Communication: Precautions;Mobility status  Activity Tolerance: Patient tolerated treatment well Patient left: in bed;with call bell/phone within reach;with bed alarm set;with restraints reapplied  OT Visit Diagnosis: Unsteadiness on feet (R26.81);Repeated falls (R29.6);Muscle weakness (generalized) (M62.81)                Time: 7425-9563 OT Time Calculation (min): 23 min Charges:  OT General Charges $OT Visit: 1 Visit OT Evaluation $OT Eval High Complexity: 1 High   Brittney Caraway P MS, OTR/L 10/14/2019, 3:28 PM

## 2019-10-14 NOTE — Evaluation (Signed)
Physical Therapy Evaluation Patient Details Name: Seth Madden MRN: [id number] DOB: [birth year] Today's Date: 10/14/2019   History of Present Illness  Seth Madden is a 86 y.o. male with medical history significant CVA, HTN, HLD, seizure, bilateral carotid stenosis presented with right-sided weakness and AMS. MRI with no acute intracranial abnormality although limited study due to motion artifact. CTA negative for emergent LVO. Work up pending for possible breakthrough seizure/status epilepticus.   Clinical Impression  Pt admitted with above. Pt presents with left gaze preference, expressive communication deficits, poor standing balance, right inattention, questionable right weakness, cognitive deficits, including decreased awareness and attention. Pt with minimal command following during session and perseverating. Requiring two person moderate assist to transfer into standing and unable to ambulate. Displays posterior bias. Currently recommending SNF.     Follow Up Recommendations SNF;Supervision/Assistance - 24 hour    Equipment Recommendations  Other (comment)(TBA)    Recommendations for Other Services       Precautions / Restrictions Precautions Precautions: Fall Restrictions Weight Bearing Restrictions: No      Mobility  Bed Mobility Overal bed mobility: Needs Assistance Bed Mobility: Supine to Sit;Sit to Supine     Supine to sit: Max assist;+2 for physical assistance Sit to supine: Max assist;+2 for physical assistance   General bed mobility comments: MaxA + 2 for initiation and execution of supine <> sit   Transfers Overall transfer level: Needs assistance Equipment used: 2 person hand held assist Transfers: Sit to/from Stand Sit to Stand: Mod assist;+2 physical assistance         General transfer comment: ModA + 2 to stand, pt with crouched posture including increased hip/knee flexion, heavy retropulsion and unable to correct  Ambulation/Gait                 Stairs            Wheelchair Mobility    Modified Rankin (Stroke Patients Only)       Balance Overall balance assessment: Needs assistance Sitting-balance support: Feet supported Sitting balance-Leahy Scale: Fair Sitting balance - Comments: Supervision for safety   Standing balance support: Bilateral upper extremity supported Standing balance-Leahy Scale: Poor Standing balance comment: reliant on external support                             Pertinent Vitals/Pain Pain Assessment: Faces Faces Pain Scale: No hurt    Home Living Family/patient expects to be discharged to:: Private residence Living Arrangements: Children(daughter)               Additional Comments: Pt unable to provide home living; per chart review, lives with his daughter    Prior Function Level of Independence: Needs assistance         Comments: Pt unable to provide PLOF     Hand Dominance        Extremity/Trunk Assessment   Upper Extremity Assessment Upper Extremity Assessment: Defer to OT evaluation    Lower Extremity Assessment Lower Extremity Assessment: Difficult to assess due to impaired cognition;RLE deficits/detail RLE Deficits / Details: At least anti gravity; no knee buckle in standing       Communication   Communication: Expressive difficulties(dysarthric)  Cognition Arousal/Alertness: Awake/alert Behavior During Therapy: Flat affect Overall Cognitive Status: Impaired/Different from baseline Area of Impairment: Attention;Following commands;Awareness                   Current Attention Level: Focused   Following Commands: Follows  one step commands inconsistently(minimal)   Awareness: Intellectual   General Comments: Pt with minimal command following, perseverating on, "don't kill me," and "I'm not scary." Decreased attention, awareness      General Comments      Exercises     Assessment/Plan    PT Assessment Patient  needs continued PT services  PT Problem List Decreased strength;Decreased activity tolerance;Decreased balance;Decreased mobility;Decreased cognition;Decreased safety awareness       PT Treatment Interventions Gait training;Therapeutic activities;Functional mobility training;Therapeutic exercise;Balance training;Patient/family education    PT Goals (Current goals can be found in the Care Plan section)  Acute Rehab PT Goals Patient Stated Goal: unable PT Goal Formulation: Patient unable to participate in goal setting Time For Goal Achievement: 10/28/19 Potential to Achieve Goals: Fair    Frequency Min 3X/week   Barriers to discharge        Co-evaluation PT/OT/SLP Co-Evaluation/Treatment: Yes Reason for Co-Treatment: Complexity of the patient's impairments (multi-system involvement);For patient/therapist safety;Necessary to address cognition/behavior during functional activity;To address functional/ADL transfers PT goals addressed during session: Mobility/safety with mobility         AM-PAC PT "6 Clicks" Mobility  Outcome Measure Help needed turning from your back to your side while in a flat bed without using bedrails?: Total Help needed moving from lying on your back to sitting on the side of a flat bed without using bedrails?: Total Help needed moving to and from a bed to a chair (including a wheelchair)?: A Lot Help needed standing up from a chair using your arms (e.g., wheelchair or bedside chair)?: A Lot Help needed to walk in hospital room?: Total Help needed climbing 3-5 steps with a railing? : Total 6 Click Score: 8    End of Session Equipment Utilized During Treatment: Gait belt Activity Tolerance: Patient tolerated treatment well(VSS) Patient left: in bed;with call bell/phone within reach;with bed alarm set;with restraints reapplied Nurse Communication: Mobility status PT Visit Diagnosis: Other abnormalities of gait and mobility (R26.89);Muscle weakness  (generalized) (M62.81)    Time: 0865-7846 PT Time Calculation (min) (ACUTE ONLY): 22 min   Charges:   PT Evaluation $PT Eval Moderate Complexity: 1 Mod            Wyona Almas, PT, DPT Acute Rehabilitation Services Pager [phone] Office [phone]   Deno Etienne 10/14/2019, 12:32 PM

## 2019-10-14 NOTE — Progress Notes (Signed)
Hand off of four point restrains given. Patient extremities assessed.

## 2019-10-14 NOTE — Progress Notes (Addendum)
PROGRESS NOTE  Seth Madden YBO:[id number] DOB: [birth year] DOA: 10/13/2019 PCP: Seth Christians, MD  HPI/Recap of past 24 hours: HPI from Dr Seth Madden is a 86 y.o. male with medical history significant CVA, HTN, HLD, bilateral carotid stenosis presented with right-sided weakness. Patient daughter who lives with patient reported that patient was last seen normal at 1830 on 10/12/2019. Daughter noticed that he had a left gaze preference and was not moving his right side, and called 911.  Patient has altered mental status nonverbal, most history was provided by patient's daughter and ED record.  According to patient daughter, patient received Cipro last week for UTI by PCP.  ED Course: Blood pressure was 128/80, heart rate 100-110s, CBG 128 and patient was nonvocal.  Neurology consulted.  Patient admitted for further management.    Today, saw patient with neurologist Dr. Leonel Madden in the room, still aphasic, resolved left gaze noted.  Unable to perform ROS due to aphasia.  Otherwise awake, alert, nonverbal.  Assessment/Plan: Active Problems:   Stroke (cerebrum) (HCC)   CVA (cerebral vascular accident) ([place])  CVA rule out MRI with no acute intracranial abnormality although limited study due to motion artifact, consider normal pressure hydrocephalus in the appropriate clinical scenario due to prominently dilated ventricles CTA head/neck/cerebral perfusion negative for emergent large vessel occlusion LDL 46, A1c 5.7 Echo pending Neurology on board Frequent neuro checks, telemetry PT/OT/SLP-SLP recommends n.p.o. for now  History of seizure disorder with possible breakthrough seizure/status epilepticus Recently received quinolones for UTI, ??lower seizure threshold, in addition to recent medication changes EEG with evidence of epileptogenic city in the left frontotemporal region, moderate diffuse encephalopathy Neurology on board, recommend overnight EEG Continue home  Vimpat, Depakote Continue overnight EEG Seizure precautions  Acute metabolic encephalopathy Likely due to above Management as above  AKI on CKD stage IIIa Improving  Continue IV fluids Daily BMP       Malnutrition Type:      Malnutrition Characteristics:      Nutrition Interventions:       Estimated body mass index is 17.93 kg/m as calculated from the following:   Height as of this encounter: 5\' 8"  (1.727 m).   Weight as of this encounter: 53.5 kg.     Code Status: DNR  Family Communication: None at bedside  Disposition Plan: To be determined, pending clinical improvement   Consultants:  Neurology  Procedures:  24-hour EEG  Antimicrobials:  None  DVT prophylaxis: Heparin   Objective: Vitals:   10/13/19 2344 10/14/19 0406 10/14/19 0824 10/14/19 1145  BP: (!) 144/81 120/70 (!) 145/87 117/80  Pulse: 86 92 87 73  Resp: 20 20 16 20   Temp: 98.1 F (36.7 C) 98.7 F (37.1 C) 98.9 F (37.2 C) 98.2 F (36.8 C)  TempSrc: Oral Oral Axillary Axillary  SpO2: 97% 95% 97% 100%  Weight:      Height: 5\' 8"  (1.727 m)       Intake/Output Summary (Last 24 hours) at 10/14/2019 1146 Last data filed at 10/14/2019 0500 Gross per 24 hour  Intake 481.29 ml  Output --  Net 481.29 ml   Filed Weights   10/13/19 2151  Weight: 53.5 kg    Exam:  General: NAD, aphasic, unable to follow commands, awake, alert  Cardiovascular: S1, S2 present  Respiratory: CTAB  Abdomen: Soft, nontender, nondistended, bowel sounds present  Musculoskeletal: No bilateral pedal edema noted  Skin: Normal  Psychiatry:  Unable to assess  Neurology: Aphasic, unable to follow commands  Data Reviewed: CBC: Recent Labs  Lab 10/13/19 0856 10/13/19 0900 10/14/19 0751  WBC 12.7*  --  8.6  NEUTROABS 10.9*  --  6.8  HGB 12.9* 13.3 12.4*  HCT 41.4 39.0 38.0*  MCV 92.4  --  89.8  PLT 127*  --  126*   Basic Metabolic Panel: Recent Labs  Lab 10/13/19 0856  10/13/19 0900 10/14/19 0751  NA 142 143 146*  K 4.0 4.0 3.7  CL 104 108 108  CO2 21*  --  24  GLUCOSE 136* 131* 94  BUN 18 18 19   CREATININE 2.22* 2.10* 1.54*  CALCIUM 9.0  --  8.8*   GFR: Estimated Creatinine Clearance: 26.1 mL/min (A) (by C-G formula based on SCr of 1.54 mg/dL (H)). Liver Function Tests: Recent Labs  Lab 10/13/19 0856  AST 32  ALT 16  ALKPHOS 83  BILITOT 0.8  PROT 8.1  ALBUMIN 4.1   No results for input(s): LIPASE, AMYLASE in the last 168 hours. No results for input(s): AMMONIA in the last 168 hours. Coagulation Profile: Recent Labs  Lab 10/13/19 0856  INR 1.3*   Cardiac Enzymes: No results for input(s): CKTOTAL, CKMB, CKMBINDEX, TROPONINI in the last 168 hours. BNP (last 3 results) No results for input(s): PROBNP in the last 8760 hours. HbA1C: Recent Labs    10/14/19 0751  HGBA1C 5.7*   CBG: Recent Labs  Lab 10/13/19 0855  GLUCAP 121*   Lipid Profile: Recent Labs    10/14/19 0751  CHOL 118  HDL 55  LDLCALC 46  TRIG 87  CHOLHDL 2.1   Thyroid Function Tests: No results for input(s): TSH, T4TOTAL, FREET4, T3FREE, THYROIDAB in the last 72 hours. Anemia Panel: No results for input(s): VITAMINB12, FOLATE, FERRITIN, TIBC, IRON, RETICCTPCT in the last 72 hours. Urine analysis:    Component Value Date/Time   COLORURINE YELLOW 10/13/2019 0425   APPEARANCEUR CLEAR 10/13/2019 0425   LABSPEC 1.032 (H) 10/13/2019 0425   PHURINE 6.0 10/13/2019 0425   GLUCOSEU NEGATIVE 10/13/2019 0425   HGBUR LARGE (A) 10/13/2019 0425   BILIRUBINUR NEGATIVE 10/13/2019 0425   KETONESUR 5 (A) 10/13/2019 0425   PROTEINUR 100 (A) 10/13/2019 0425   NITRITE NEGATIVE 10/13/2019 0425   LEUKOCYTESUR NEGATIVE 10/13/2019 0425   Sepsis Labs: @LABRCNTIP (procalcitonin:4,lacticidven:4)  ) Recent Results (from the past 240 hour(s))  Respiratory Panel by RT PCR (Flu A&B, Covid) - Nasopharyngeal Swab     Status: None   Collection Time: 10/13/19  9:27 AM    Specimen: Nasopharyngeal Swab  Result Value Ref Range Status   SARS Coronavirus 2 by RT PCR NEGATIVE NEGATIVE Final    Comment: (NOTE) SARS-CoV-2 target nucleic acids are NOT DETECTED. The SARS-CoV-2 RNA is generally detectable in upper respiratoy specimens during the acute phase of infection. The lowest concentration of SARS-CoV-2 viral copies this assay can detect is 131 copies/mL. A negative result does not preclude SARS-Cov-2 infection and should not be used as the sole basis for treatment or other patient management decisions. A negative result may occur with  improper specimen collection/handling, submission of specimen other than nasopharyngeal swab, presence of viral mutation(s) within the areas targeted by this assay, and inadequate number of viral copies (<131 copies/mL). A negative result must be combined with clinical observations, patient history, and epidemiological information. The expected result is Negative. Fact Sheet for Patients:  Fact Sheet for Healthcare Providers:  10/15/19 This test is not yet ap proved or cleared by the https://www.moore.com/ FDA and  has been  authorized for detection and/or diagnosis of SARS-CoV-2 by FDA under an Emergency Use Authorization (EUA). This EUA will remain  in effect (meaning this test can be used) for the duration of the COVID-19 declaration under Section 564(b)(1) of the Act, 21 U.S.C. section 360bbb-3(b)(1), unless the authorization is terminated or revoked sooner.    Influenza A by PCR NEGATIVE NEGATIVE Final   Influenza B by PCR NEGATIVE NEGATIVE Final    Comment: (NOTE) The Xpert Xpress SARS-CoV-2/FLU/RSV assay is intended as an aid in  the diagnosis of influenza from Nasopharyngeal swab specimens and  should not be used as a sole basis for treatment. Nasal washings and  aspirates are unacceptable for Xpert Xpress SARS-CoV-2/FLU/RSV  testing. Fact Sheet  for Patients: https://www.moore.com/ Fact Sheet for Healthcare Providers: https://www.young.biz/ This test is not yet approved or cleared by the [place] FDA and  has been authorized for detection and/or diagnosis of SARS-CoV-2 by  FDA under an Emergency Use Authorization (EUA). This EUA will remain  in effect (meaning this test can be used) for the duration of the  Covid-19 declaration under Section 564(b)(1) of the Act, 21  U.S.C. section 360bbb-3(b)(1), unless the authorization is  terminated or revoked. Performed at [facility], [address], [place], [place] [address]       Studies: EEG  Result Date: 10/13/2019 Seth Quest, MD     10/13/2019 12:51 PM Patient Name: Seth Madden MRN: [id number] Epilepsy Attending: Charlsie Madden Referring Physician/Provider: [name], PA Date: 10/13/2019 Duration: 23.12 minutes Patient history: 86 year old male presented as a stroke alert with left-sided gaze preference, nonverbal and right-sided weakness.  EEG to evaluate for status epilepticus. Level of alertness: Lethargic AEDs during EEG study: None Technical aspects: This EEG study was done with scalp electrodes positioned according to the 10-20 International system of electrode placement. Electrical activity was acquired at a sampling rate of 500Hz  and reviewed with a high frequency filter of 70Hz  and a low frequency filter of 1Hz . EEG data were recorded continuously and digitally stored. Description: No clear posterior dominant was seen.  EEG showed continuous generalized 3 to 5 Hz theta-delta slowing.  Sharp waves were seen in left frontotemporal region, maximal F7. Hyperventilation and photic stimulation were not performed. Abnormality -Continuous slow, generalized -Sharp wave, left frontotemporal region IMPRESSION: This study showed evidence of epileptogenic city in the left frontotemporal region.  Additionally there is evidence of  moderate diffuse encephalopathy, nonspecific etiology. No seizures were seen throughout the recording.   DG Chest 2 View  Result Date: 10/13/2019 CLINICAL DATA:  Stroke. EXAM: CHEST - 2 VIEW COMPARISON:  08/21/2019 FINDINGS: Lungs appear hyperexpanded. No focal consolidation or pulmonary edema. No pleural effusion. The cardiopericardial silhouette is within normal limits for size. The visualized bony structures of the thorax are intact. IMPRESSION: No active cardiopulmonary disease. Electronically Signed   By: M.D.   On: 10/13/2019 15:49   MR BRAIN WO CONTRAST  Result Date: 10/13/2019 CLINICAL DATA:  Neuro deficit. Stroke suspected. EXAM: MRI HEAD WITHOUT CONTRAST TECHNIQUE: Multiplanar, multiecho pulse sequences of the brain and surrounding structures were obtained without intravenous contrast. COMPARISON:  Head CT October 13, 2019. FINDINGS: Limited study due to motion. Most acquired images are essentially nondiagnostic. Brain: No acute infarction, hemorrhage, extra-axial collection or large mass lesion. Prominently dilated supratentorial ventricles with associated bowing of the corpus callosum and crowding of the parietal sulci may represent normal pressure hydrocephalus in the appropriate clinical scenario. Vascular: Evaluation precluded by motion.  Skull and upper cervical spine: Grossly unremarkable. Sinuses/Orbits: Negative. Other: None. IMPRESSION: 1. Significantly limited study due to motion artifact. 2. No acute intracranial abnormality identified. 3. Prominently dilated supratentorial ventricles with associated bowing of corpus callosum and crowding of parietal sulci may represent normal pressure hydrocephalus in the appropriate clinical scenario. Electronically Signed   By: Baldemar Lenis M.D.   On: 10/13/2019 14:51    Scheduled Meds: .  stroke: mapping our early stages of recovery book   Does not apply Once  . aspirin EC  81 mg Oral Daily  . atorvastatin  40 mg  Oral QHS  . divalproex  1,000 mg Oral Daily  . heparin  5,000 Units Subcutaneous Q12H    Continuous Infusions: . sodium chloride 75 mL/hr at 10/13/19 2354  . lacosamide (VIMPAT) IV 100 mg (10/14/19 1119)     LOS: 1 day     Seth Cedar, MD [facility]  If 7PM-7AM, please contact night-coverage [email or web address] 10/14/2019, 11:46 AM

## 2019-10-14 NOTE — Progress Notes (Signed)
vLTM EEG Started. Notified Neuro 

## 2019-10-14 NOTE — Progress Notes (Signed)
NEUROLOGY PROGRESS NOTE   Subjective: Patient sitting up with physical therapy and Occupational Therapy.  Has slight tremor.  He is perseverating that he is at home/hospital and asked questions such as where he is he and the month.  Exam: Vitals:   10/14/19 0406 10/14/19 0824  BP: 120/70 (!) 145/87  Pulse: 92 87  Resp: 20 16  Temp: 98.7 F (37.1 C) 98.9 F (37.2 C)  SpO2: 95% 97%    ROS Unable to obtain secondary to patient's confusion    Physical Exam  Constitutional: Cachectic Psych: Confused Eyes: No scleral injection HENT: No OP obstrucion Head: Normocephalic.  Cardiovascular: Normal rate and regular rhythm.  Respiratory: Effort normal, non-labored breathing GI: Soft.  No distension. There is no tenderness.  Skin: WDI   Neuro:  Mental Status: Alert, confused-perseverating on "hospital/home ".  With physical therapy perseverating on "please do not hurt me " does smile on command, does squeeze hands on command, will hold arms up to command, will lift legs up to command. Cranial Nerves: II: Blinks to threat bilaterally III,IV, VI: ptosis not present, extra-ocular motions intact bilaterally pupils equal, round, reactive to light and accommodation V,VII: Right facial droop VIII: hearing normal bilaterally Motor: Moving all extremities antigravity Sensory: Pinprick and light touch intact throughout, bilaterally Deep Tendon Reflexes: 2+ and symmetric throughout upper extremities and knee jerk    Medications:  Scheduled: .  stroke: mapping our early stages of recovery book   Does not apply Once  . aspirin EC  81 mg Oral Daily  . atorvastatin  40 mg Oral QHS  . divalproex  1,000 mg Oral Daily  . heparin  5,000 Units Subcutaneous Q12H   Continuous: . sodium chloride 75 mL/hr at 10/13/19 2354    Pertinent Labs/Diagnostics: -VPA level 112  EEG  Result Date: 10/13/2019 Lora Havens, MD     10/13/2019 12:51 PMIMPRESSION: This study showed evidence of  epileptogenic city in the left frontotemporal region.  Additionally there is evidence of moderate diffuse encephalopathy, nonspecific etiology. No seizures were seen throughout the recording.     CT ANGIO NECK W OR WO CONTRAST  Result Date: 10/13/2019  IMPRESSION: 1. No emergent large vessel occlusion. 2. CT perfusion demonstrates no significant core infarct or penumbra. 3. Stable atherosclerotic changes involving the carotid bifurcations bilaterally, left greater than right. No significant stenosis is present. 4. Stable atherosclerotic changes involving the cavernous internal carotid arteries bilaterally without significant stenosis. 5. High-grade stenosis at the origin of the non dominant left vertebral artery. Electronically Signed   By: San Morelle M.D.   On: 10/13/2019 09:29   MR BRAIN WO CONTRAST  Result Date: 10/13/2019 . IMPRESSION: 1. Significantly limited study due to motion artifact. 2. No acute intracranial abnormality identified. 3. Prominently dilated supratentorial ventricles with associated bowing of corpus callosum and crowding of parietal sulci may represent normal pressure hydrocephalus in the appropriate clinical scenario. Electronically Signed   By: Pedro Earls M.D.   On: 10/13/2019 14:51   CT CEREBRAL PERFUSION W CONTRAST  Result Date: 10/13/2019 . IMPRESSION: 1. No emergent large vessel occlusion. 2. CT perfusion demonstrates no significant core infarct or penumbra. 3. Stable atherosclerotic changes involving the carotid bifurcations bilaterally, left greater than right. No significant stenosis is present. 4. Stable atherosclerotic changes involving the cavernous internal carotid arteries bilaterally without significant stenosis. 5. High-grade stenosis at the origin of the non dominant left vertebral artery. Electronically Signed   By: Wynetta Fines.D.  On: 10/13/2019 09:29   CT HEAD CODE STROKE WO CONTRAST  Result Date:  10/13/2019 CLINICAL DATA:  Code stroke. Focal neuro deficit for greater than 6 hours. Right arm and leg weakness. Left-sided gaze IMPRESSION: 1. Focal loss of gray-white differentiation in the left precentral gyrus near the vertex. 2. No other focal cortical or basal ganglia acute abnormality. 3. Advanced atrophy and diffuse white matter disease likely reflects the sequela of chronic microvascular ischemia. 4. ASPECTS is 10/10 The above was relayed via text pager to Dr. Amada Jupiter on 10/13/2019 at 09:13 . Electronically Signed   By: Marin Roberts M.D.   On: 10/13/2019 09:13     Seth Morn PA-C [facility] [phone]  Impression: 86 year old male with history of seizure who presented to the emergency room with right hemiplegia and aphasia without clear cause on vascular imaging.  MRI did not show any acute strokes, EEG did not show any seizure but did show left-sided sharp waves.  Current Depakote level 112.  At this point time will look patient back up to EEG for LTM to evaluate for possible intermittent seizures not noted on first EEG.   Recommendations: -Recheck Depakote level -Continue Vimpat at 100 mg twice daily  -LTM -Neurology will follow -Soft restraints at ankles and wrists in order to get LTM-ordered  Seth Slot, MD [facility] [phone]  If 7pm- 7am, please page neurology on call as listed in AMION.   10/14/2019, 9:22 AM

## 2019-10-14 NOTE — Procedures (Addendum)
Patient Name: Seth Madden  MRN: [id number]  Epilepsy Attending: Charlsie Quest  Referring Physician/Provider: [name], PA Duration: 10/13/2019 1543 to 1750 10/14/2019 0953 to 10/15/2019 0753  Patient history: 86 year old male presented as a stroke alert with left-sided gaze preference, nonverbal and right-sided weakness.  EEG to evaluate for status epilepticus.  Level of alertness: Lethargic, asleep  AEDs during EEG study: VPA, Vimpat  Technical aspects: This EEG study was done with scalp electrodes positioned according to the 10-20 International system of electrode placement. Electrical activity was acquired at a sampling rate of 500Hz  and reviewed with a high frequency filter of 70Hz  and a low frequency filter of 1Hz . EEG data were recorded continuously and digitally stored.   Description: During awake state, no clear posterior dominant rhythm was seen.  Sleep was characterized by vertex waves, sleep spindles (12 to 15 Hz), maximal frontocentral.  EEG showed continuous generalized 3 to 5 Hz theta-delta slowing.  Frequent sharp waves were seen in left frontotemporal region, maximal F7, at times rhythmic without any clear evolution. Hyperventilation and photic stimulation were not performed.  Abnormality -Continuous slow, generalized -Sharp wave, left frontotemporal region  IMPRESSION: This study showed evidence of epileptogenicity in the left frontotemporal region.  Additionally there is evidence of moderate diffuse encephalopathy, nonspecific etiology. No seizures were seen throughout the recording.  Seth Madden 

## 2019-10-14 NOTE — Progress Notes (Signed)
Echocardiogram 2D Echocardiogram has been performed.  Seth Madden 10/14/2019, 8:57 AM

## 2019-10-14 NOTE — Evaluation (Signed)
Clinical/Bedside Swallow Evaluation Patient Details  Name: Seth Madden MRN: [id number] Date of Birth: 1931/01/07  Today's Date: 10/14/2019 Time: SLP Start Time (ACUTE ONLY): 0753 SLP Stop Time (ACUTE ONLY): 0823 SLP Time Calculation (min) (ACUTE ONLY): 30 min  Past Medical History:  Past Medical History:  Diagnosis Date  . Bilateral carotid artery stenosis   . Dysphagia   . Hyperlipidemia   . Hypertension   . Seizure (HCC)   . Stroke (HCC)   . UTI (urinary tract infection)    Past Surgical History: History reviewed. No pertinent surgical history. HPI:  86 yo male adm to [facility] with AMS - breakthrough seizures vs strokes.  MRI showed probable hydrocephalus.  Pt EEG showed left frontao parietal epiptogenic diffuse encephalopathy.  Pt has PMH of remote CVA approx 10 years ago.  Pt has h/o dysphagia per chart review.  Swallow and speech evaluations ordered.   Assessment / Plan / Recommendation Clinical Impression  Pt presents with clinical indications of suspected cognitive and sensorimotor based dysphagia.  Dried bloodly secretions noted in oral cavity which SlP cleaned as much as able.   After lower denture removed and cleaned, pt noted to have fresh blood - provided gentle oral care and did not replace denture. Advised RN to bleeding. Pt with significant CN deficits impacting facial and hypoglossal nerves at least - He did not follow directions adequately for evaluation.  Poor awareness to boluses offered and pt did not close his mouth on suction or boluses with total cues.  He did not elicit swallow despite total cues, spoon pressure in attempts to elicit swallow and thus SlP removed all boluses.   Motor planning appears severely impaired at this time.  Recommend NPO, gentle oral care and SLP will follow up for readiness for po, instrumental evaluation, etc.  Given bleeding at this time, ? if it would be contraindicated to place Cortrak? SLP Visit Diagnosis: Dysphagia, oropharyngeal  phase (R13.12)    Aspiration Risk    Severe  Diet Recommendation NPO   Medication Administration: Via alternative means    Other  Recommendations Oral Care Recommendations: Oral care QID   Follow up Recommendations Skilled Nursing facility      Frequency and Duration min 1 x/week  1 week       Prognosis Prognosis for Safe Diet Advancement: Fair Barriers to Reach Goals: Severity of deficits      Swallow Study   General HPI: 86 yo male adm to [facility] with AMS - breakthrough seizures vs strokes.  MRI showed probable hydrocephalus.  Pt EEG showed left frontao parietal epiptogenic diffuse encephalopathy.  Pt has PMH of remote CVA approx 10 years ago.  Pt has h/o dysphagia per chart review.  Swallow and speech evaluations ordered. Type of Study: Bedside Swallow Evaluation Previous Swallow Assessment: 2019 mild cognitive based dysphagia, recommend regular/thin diet via clinical swallow eval Diet Prior to this Study: NPO Temperature Spikes Noted: No Respiratory Status: Nasal cannula History of Recent Intubation: No Behavior/Cognition: Alert;Doesn't follow directions;Confused;Distractible Oral Cavity Assessment: Other (comment)(dried bloody secretions noted - LP provided oral care and removed lower denture, fresh bleeding noted at lower gums - therefore SLP did not replace lower dentures) Oral Care Completed by SLP: Yes Oral Cavity - Dentition: Dentures, top;Dentures, bottom Vision: Impaired for self-feeding Self-Feeding Abilities: Total assist Patient Positioning: Upright in bed Baseline Vocal Quality: Low vocal intensity(pt only intermittently voice) Volitional Swallow: Unable to elicit    Oral/Motor/Sensory Function Overall Oral Motor/Sensory Function: Moderate impairment Facial ROM: Suspected CN  VII (facial) dysfunction;Reduced right Facial Symmetry: Abnormal symmetry right;Suspected CN VII (facial) dysfunction Facial Strength: Reduced right;Suspected CN VII (facial)  dysfunction Lingual ROM: Reduced right Lingual Strength: Reduced Velum: Within Functional Limits   Ice Chips Ice chips: Not tested Other Comments: pt advised he does not like ice   Thin Liquid Thin Liquid: Impaired Presentation: Spoon Oral Phase Impairments: Reduced labial seal;Reduced lingual movement/coordination;Poor awareness of bolus Oral Phase Functional Implications: Oral holding;Left lateral sulci pocketing;Right lateral sulci pocketing    Nectar Thick Nectar Thick Liquid: Impaired Presentation: Cup;Spoon Oral Phase Impairments: Reduced labial seal;Poor awareness of bolus Oral phase functional implications: Oral holding;Right lateral sulci pocketing;Left lateral sulci pocketing   Honey Thick Honey Thick Liquid: Not tested   Puree Puree: Not tested   Solid     Solid: Not tested      Seth Madden 10/14/2019,9:00 AM  Seth Lime, MS [place] Office [phone]

## 2019-10-14 NOTE — Evaluation (Signed)
Speech Language Pathology Evaluation Patient Details Name: Seth Madden MRN: [id number] DOB: 1931-02-07 Today's Date: 10/14/2019 Time: 1740-8144 SLP Time Calculation (min) (ACUTE ONLY): 16 min  Problem List:  Patient Active Problem List   Diagnosis Date Noted  . 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 CVA (cerebral vascular accident) (HCC) 10/13/2019  . Stroke-like symptoms   . Altered mental status   . Essential hypertension 11/25/2017  . Hyperlipidemia 11/25/2017  . Recurrent seizures (HCC) 11/24/2017   Past Medical History:  Past Medical History:  Diagnosis Date  . Bilateral carotid artery stenosis   . Dysphagia   . Hyperlipidemia   . Hypertension   . Seizure (HCC)   . Stroke (HCC)   . UTI (urinary tract infection)    Past Surgical History: History reviewed. No pertinent surgical history. HPI:  85 yo male adm to [facility] with AMS - breakthrough seizures vs strokes.  MRI showed probable hydrocephalus.  Pt EEG showed left frontao parietal epiptogenic diffuse encephalopathy.  Pt has PMH of remote CVA approx 10 years ago.  Swallow and speech evaluations ordered.   Assessment / Plan / Recommendation Clinical Impression  Patient presents with severe cognitive linguistic deficits and at minimum facial and hypoglossal nerve deficits - pt with minimal direction following.  His focal attention is impaired - max 4 seconds at a time with left gaze preference.  Pt is perseverative saying "I don't think so" repeatedly at beginning of session fading to no verbalizations at end.  Moderately severe dysarthria noted which results in decreased intelligibility when pt attempts to speak.  He did answer no to SLP asking re: pain and if could take out upper dentures for cleaning.   He is unable at this time to functionally participate in his medical decision making/care.  Suspect some speech and language deficits due to left frontoparietal epiptogenic diffuse encephalopathy.  Pt will benefit from SlP  to maximize funcitonal cognitive linguistic skills to decrease burden of care.    SLP Assessment  SLP Recommendation/Assessment: Patient needs continued Speech Lanaguage Pathology Services SLP Visit Diagnosis: Cognitive communication deficit (R41.841)    Follow Up Recommendations  Skilled Nursing facility    Frequency and Duration min 2x/week  2 weeks      SLP Evaluation Cognition  Overall Cognitive Status: Impaired/Different from baseline Arousal/Alertness: Awake/alert Orientation Level: Disoriented to place;Disoriented to time;Disoriented to situation;Disoriented to person Attention: Focused Focused Attention: Impaired Focused Attention Impairment: Verbal basic;Functional basic Memory: Impaired Awareness: Impaired Problem Solving: Impaired Problem Solving Impairment: Functional basic Behaviors: Perseveration Safety/Judgment: Impaired       Comprehension  Auditory Comprehension Overall Auditory Comprehension: Impaired Yes/No Questions: Impaired Basic Biographical Questions: 0-25% accurate Commands: Impaired One Step Basic Commands: 0-24% accurate Other Conversation Comments: with total tactile and verbal cues pt followed one direction of 7 only Interfering Components: Attention Reading Comprehension Reading Status: Impaired(pt did not identify name from verbal or written choice of two- ? visual field cut?) Word level: Impaired Sentence Level: Not tested Paragraph Level: Not tested Functional Environmental (signs, name badge): Not tested Interfering Components: Attention;Right neglect/inattention Effective Techniques: (pt did not repeat his name nor letters with total cues)    Expression Expression Primary Mode of Expression: Verbal Verbal Expression Overall Verbal Expression: Impaired Initiation: Impaired Repetition: Impaired(sound level) Naming: Impairment Responsive: 0-25% accurate Confrontation: Impaired Convergent: 0-24% accurate Interfering Components:  Attention;Speech intelligibility Written Expression Dominant Hand: (per prior chart review, pt was right handed, now with right sided weakness) Written Expression: Not tested   Oral / Motor  Oral Motor/Sensory Function Overall Oral Motor/Sensory Function: Moderate impairment Facial ROM: Suspected CN VII (facial) dysfunction;Reduced right Facial Symmetry: Abnormal symmetry right;Suspected CN VII (facial) dysfunction Facial Strength: Reduced right;Suspected CN VII (facial) dysfunction Lingual ROM: Reduced right Lingual Strength: Reduced Velum: Within Functional Limits Motor Speech Overall Motor Speech: Impaired Respiration: Impaired Level of Impairment: Word Phonation: Low vocal intensity Articulation: Impaired Level of Impairment: Word Intelligibility: Intelligibility reduced Word: 25-49% accurate Phrase: 0-24% accurate Sentence: 25-49% accurate Conversation: Not tested Motor Planning: (uncertain if decreased motor planning and/or due to his decreased attention)   GO                    [name] 10/14/2019, 8:46 AM  [name], MS [place] Office [phone]

## 2019-10-15 DIAGNOSIS — I639 Cerebral infarction, unspecified: Secondary | ICD-10-CM

## 2019-10-15 LAB — BASIC METABOLIC PANEL
Anion gap: 14 (ref 5–15)
BUN: 27 mg/dL — ABNORMAL HIGH (ref 8–23)
CO2: 21 mmol/L — ABNORMAL LOW (ref 22–32)
Calcium: 8.6 mg/dL — ABNORMAL LOW (ref 8.9–10.3)
Chloride: 110 mmol/L (ref 98–111)
Creatinine, Ser: 1.52 mg/dL — ABNORMAL HIGH (ref 0.61–1.24)
GFR calc Af Amer: 47 mL/min — ABNORMAL LOW (ref >60)
GFR calc non Af Amer: 41 mL/min — ABNORMAL LOW (ref >60)
Glucose, Bld: 84 mg/dL (ref 70–99)
Potassium: 4 mmol/L (ref 3.5–5.1)
Sodium: 145 mmol/L (ref 135–145)

## 2019-10-15 LAB — URINE CULTURE: Culture: NO GROWTH

## 2019-10-15 LAB — CBC WITH DIFFERENTIAL/PLATELET
Abs Immature Granulocytes: 0.03 10*3/uL (ref 0.00–0.07)
Basophils Absolute: 0.1 10*3/uL (ref 0.0–0.1)
Basophils Relative: 1 %
Eosinophils Absolute: 0 10*3/uL (ref 0.0–0.5)
Eosinophils Relative: 0 %
HCT: 37.3 % — ABNORMAL LOW (ref 39.0–52.0)
Hemoglobin: 12.2 g/dL — ABNORMAL LOW (ref 13.0–17.0)
Immature Granulocytes: 0 %
Lymphocytes Relative: 19 %
Lymphs Abs: 1.5 10*3/uL (ref 0.7–4.0)
MCH: 29.4 pg (ref 26.0–34.0)
MCHC: 32.7 g/dL (ref 30.0–36.0)
MCV: 89.9 fL (ref 80.0–100.0)
Monocytes Absolute: 1.1 10*3/uL — ABNORMAL HIGH (ref 0.1–1.0)
Monocytes Relative: 13 %
Neutro Abs: 5.4 10*3/uL (ref 1.7–7.7)
Neutrophils Relative %: 67 %
Platelets: 100 10*3/uL — ABNORMAL LOW (ref 150–400)
RBC: 4.15 MIL/uL — ABNORMAL LOW (ref 4.22–5.81)
RDW: 13.2 % (ref 11.5–15.5)
WBC: 8 10*3/uL (ref 4.0–10.5)
nRBC: 0 % (ref 0.0–0.2)

## 2019-10-15 MED ORDER — DEXTROSE-NACL 5-0.45 % IV SOLN: Freq: Once | INTRAVENOUS | Status: AC

## 2019-10-15 NOTE — TOC Initial Note (Signed)
Transition of Care [facility]) - Initial/Assessment Note    Patient Details  Name: Seth Madden MRN: [id number] Date of Birth: 1930/09/07  Transition of [facility]) CM/SW Contact:    Kirstie Peri, [place] Work Phone Number: 10/15/2019, 1:35 PM  Clinical Narrative:                 MSW Intern spoke with daughter, Manuela Schwartz, to discuss discharge planning for the pt. Daughter states that pt has lived alone for a while, but she stops in twice a day to help him. She has concerns about him going to rehab, as he went to [facility] last time for two weeks, and he refused to participate. She also said he has refused HH therapies in the past.  Her plan is to have him go home and for a family friend to stay with him. She wasn't sure if they would need [place] aides, but would like to start out with them. At the home, he has a 3 in 1, walker, and cane. She says the brother is who gives him his baths. She wants to discuss the options with her brother and follow up. SW will continue to follow.  Expected Discharge Plan: [place] Barriers to Discharge: Continued Medical Work up   Patient Goals and CMS Choice Patient states their goals for this hospitalization and ongoing recovery are:: Pt family wants pt to return home with [facility]. CMS Medicare.gov Compare Post Acute Care list provided to:: Patient Choice offered to / list presented to : Adult Children  Expected Discharge Plan and Services Expected Discharge Plan: [place] In-house Referral: Clinical Social Work   Post Acute Care Choice: [place] arrangements for the past 2 months: [place]                                      Prior Living Arrangements/Services Living arrangements for the past 2 months: Single Family Home Lives with:: Self Patient language and need for interpreter reviewed:: Yes Do you feel safe going back to the place where you live?: Yes      Need for Family  Participation in Patient Care: Yes (Comment) Care giver support system in place?: Yes (comment)   Criminal Activity/Legal Involvement Pertinent to Current Situation/Hospitalization: No - Comment as needed  Activities of Daily Living Home Assistive Devices/Equipment: Walker (specify type) ADL Screening (condition at time of admission) Patient's cognitive ability adequate to safely complete daily activities?: No Is the patient deaf or have difficulty hearing?: No Does the patient have difficulty seeing, even when wearing glasses/contacts?: Yes Does the patient have difficulty concentrating, remembering, or making decisions?: Yes Patient able to express need for assistance with ADLs?: No Does the patient have difficulty dressing or bathing?: Yes Independently performs ADLs?: No Communication: Dependent Is this a change from baseline?: Change from baseline, expected to last >3 days Dressing (OT): Dependent Is this a change from baseline?: Change from baseline, expected to last >3 days Grooming: Dependent Is this a change from baseline?: Change from baseline, expected to last >3 days Feeding: Dependent Is this a change from baseline?: Change from baseline, expected to last >3 days Bathing: Dependent Is this a change from baseline?: Change from baseline, expected to last >3 days Toileting: Dependent Is this a change from baseline?: Change from baseline, expected to last >3days In/Out Bed: Dependent Is this a change from baseline?:  Change from baseline, expected to last >3 days Walks in Home: Independent with device (comment) Does the patient have difficulty walking or climbing stairs?: Yes Weakness of Legs: Right Weakness of Arms/Hands: Right  Permission Sought/Granted Permission sought to share information with : Facility Industrial/product designer granted to share information with : Yes, Verbal Permission Granted  Share Information with NAME: Nicki Reaper     Permission  granted to share info w Relationship: Daughter  Permission granted to share info w Contact Information: [phone]  Emotional Assessment Appearance:: Other (Comment Required(unable to assess) Attitude/Demeanor/Rapport: Unable to Assess Affect (typically observed): Unable to Assess   Alcohol / Substance Use: Not Applicable Psych Involvement: No (comment)  Admission diagnosis:  CVA (cerebral vascular accident) (HCC) [I63.9] Stroke (cerebrum) (HCC) [I63.9] Stroke-like symptoms [R29.90] Altered mental status, unspecified altered mental status type [R41.82] Patient Active Problem List   Diagnosis Date Noted  . 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 CVA (cerebral vascular accident) (HCC) 10/13/2019  . Stroke-like symptoms   . Altered mental status   . Essential hypertension 11/25/2017  . Hyperlipidemia 11/25/2017  . Recurrent seizures (HCC) 11/24/2017   PCP:  Mauricia Area, MD Pharmacy:   [phone] [place], [place] - [address] AT [facility] [address], [address] [place] [address] Phone: [phone] Fax: [phone]     Social Determinants of Health (SDOH) Interventions    Readmission Risk Interventions No flowsheet data found.

## 2019-10-15 NOTE — Progress Notes (Signed)
PROGRESS NOTE  Keithen Capo DEY:[id number] DOB: [birth year] DOA: 10/13/2019 PCP: [name], MD  HPI/Recap of past 24 hours: HPI from Dr Lin [name] is a 86 y.o. male with medical history significant CVA, HTN, HLD, bilateral carotid stenosis presented with right-sided weakness. Patient daughter who lives with patient reported that patient was last seen normal at 1830 on 10/12/2019. Daughter noticed that he had a left gaze preference and was not moving his right side, and called 911.  Patient has altered mental status nonverbal, most history was provided by patient's daughter and ED record.  According to patient daughter, patient received Cipro last week for UTI by PCP.  ED Course: Blood pressure was 128/80, heart rate 100-110s, CBG 128 and patient was nonvocal.  Neurology consulted.  Patient admitted for further management.    Today, patient noted to be speaking more, requesting water as his mouth is dry.  Noted slight improvement overall.  Assessment/Plan: Active Problems:   Stroke (cerebrum) (HCC)   CVA (cerebral vascular accident) ([place])  CVA rule out MRI with no acute intracranial abnormality although limited study due to motion artifact, consider normal pressure hydrocephalus in the appropriate clinical scenario due to prominently dilated ventricles CTA head/neck/cerebral perfusion negative for emergent large vessel occlusion LDL 46, A1c 5.7 Echo with EF 60 to 65%, no regional wall motion abnormalities, no intracardiac source of embolus detected Neurology on board Frequent neuro checks, telemetry PT/OT/SLP-SLP recommends n.p.o. for now  History of seizure disorder with possible breakthrough seizure/status epilepticus Recently received quinolones for UTI, ??lower seizure threshold, in addition to recent medication changes EEG with evidence of epileptogenic city in the left frontotemporal region, moderate diffuse encephalopathy Neurology on board, recommend  overnight EEG which showed evidence of epileptogenic city in the left frontotemporal region, with evidence of moderate diffuse encephalopathy, nonspecific etiology.  No seizures were seen throughout the recording Continue home Vimpat, Depakote Seizure precautions  Acute metabolic encephalopathy Likely due to above Management as above  AKI on CKD stage IIIa Improving  Continue IV fluids Daily BMP       Malnutrition Type:      Malnutrition Characteristics:      Nutrition Interventions:       Estimated body mass index is 17.93 kg/m as calculated from the following:   Height as of this encounter: 5\' 8"  (1.727 m).   Weight as of this encounter: 53.5 kg.     Code Status: DNR  Family Communication: None at bedside  Disposition Plan: To be determined, pending clinical improvement   Consultants:  Neurology  Procedures:  24-hour EEG  Antimicrobials:  None  DVT prophylaxis: Heparin   Objective: Vitals:   10/15/19 0527 10/15/19 0914 10/15/19 1216 10/15/19 1640  BP: (!) 162/86 (!) 151/79 (!) 156/83 (!) 154/90  Pulse: 99 84 78 98  Resp: 19 18 18 18   Temp: 98 F (36.7 C) 97.9 F (36.6 C) 98.4 F (36.9 C) 98.3 F (36.8 C)  TempSrc: Oral Oral Oral Axillary  SpO2: 96% 95% 95% 96%  Weight:      Height:       No intake or output data in the 24 hours ending 10/15/19 1722 Filed Weights   10/13/19 2151  Weight: 53.5 kg    Exam:  General: NAD  Cardiovascular: S1, S2 present  Respiratory: CTAB  Abdomen: Soft, nontender, nondistended, bowel sounds present  Musculoskeletal: No bilateral pedal edema noted  Skin: Normal  Psychiatry:  Unable to assess  Neurology: No obvious focal neurologic deficits  noted   Data Reviewed: CBC: Recent Labs  Lab 10/13/19 0856 10/13/19 0900 10/14/19 0751 10/15/19 0223  WBC 12.7*  --  8.6 8.0  NEUTROABS 10.9*  --  6.8 5.4  HGB 12.9* 13.3 12.4* 12.2*  HCT 41.4 39.0 38.0* 37.3*  MCV 92.4  --  89.8 89.9    PLT 127*  --  126* 100*   Basic Metabolic Panel: Recent Labs  Lab 10/13/19 0856 10/13/19 0900 10/14/19 0751 10/15/19 0223  NA 142 143 146* 145  K 4.0 4.0 3.7 4.0  CL 104 108 108 110  CO2 21*  --  24 21*  GLUCOSE 136* 131* 94 84  BUN 18 18 19  27*  CREATININE 2.22* 2.10* 1.54* 1.52*  CALCIUM 9.0  --  8.8* 8.6*   GFR: Estimated Creatinine Clearance: 26.4 mL/min (A) (by C-G formula based on SCr of 1.52 mg/dL (H)). Liver Function Tests: Recent Labs  Lab 10/13/19 0856  AST 32  ALT 16  ALKPHOS 83  BILITOT 0.8  PROT 8.1  ALBUMIN 4.1   No results for input(s): LIPASE, AMYLASE in the last 168 hours. No results for input(s): AMMONIA in the last 168 hours. Coagulation Profile: Recent Labs  Lab 10/13/19 0856  INR 1.3*   Cardiac Enzymes: No results for input(s): CKTOTAL, CKMB, CKMBINDEX, TROPONINI in the last 168 hours. BNP (last 3 results) No results for input(s): PROBNP in the last 8760 hours. HbA1C: Recent Labs    10/14/19 0751  HGBA1C 5.7*   CBG: Recent Labs  Lab 10/13/19 0855  GLUCAP 121*   Lipid Profile: Recent Labs    10/14/19 0751  CHOL 118  HDL 55  LDLCALC 46  TRIG 87  CHOLHDL 2.1   Thyroid Function Tests: No results for input(s): TSH, T4TOTAL, FREET4, T3FREE, THYROIDAB in the last 72 hours. Anemia Panel: No results for input(s): VITAMINB12, FOLATE, FERRITIN, TIBC, IRON, RETICCTPCT in the last 72 hours. Urine analysis:    Component Value Date/Time   COLORURINE YELLOW 10/13/2019 0425   APPEARANCEUR CLEAR 10/13/2019 0425   LABSPEC 1.032 (H) 10/13/2019 0425   PHURINE 6.0 10/13/2019 0425   GLUCOSEU NEGATIVE 10/13/2019 0425   HGBUR LARGE (A) 10/13/2019 0425   BILIRUBINUR NEGATIVE 10/13/2019 0425   KETONESUR 5 (A) 10/13/2019 0425   PROTEINUR 100 (A) 10/13/2019 0425   NITRITE NEGATIVE 10/13/2019 0425   LEUKOCYTESUR NEGATIVE 10/13/2019 0425   Sepsis Labs: @LABRCNTIP (procalcitonin:4,lacticidven:4)  ) Recent Results (from the past 240  hour(s))  Respiratory Panel by RT PCR (Flu A&B, Covid) - Nasopharyngeal Swab     Status: None   Collection Time: 10/13/19  9:27 AM   Specimen: Nasopharyngeal Swab  Result Value Ref Range Status   SARS Coronavirus 2 by RT PCR NEGATIVE NEGATIVE Final    Comment: (NOTE) SARS-CoV-2 target nucleic acids are NOT DETECTED. The SARS-CoV-2 RNA is generally detectable in upper respiratoy specimens during the acute phase of infection. The lowest concentration of SARS-CoV-2 viral copies this assay can detect is 131 copies/mL. A negative result does not preclude SARS-Cov-2 infection and should not be used as the sole basis for treatment or other patient management decisions. A negative result may occur with  improper specimen collection/handling, submission of specimen other than nasopharyngeal swab, presence of viral mutation(s) within the areas targeted by this assay, and inadequate number of viral copies (<131 copies/mL). A negative result must be combined with clinical observations, patient history, and epidemiological information. The expected result is Negative. Fact Sheet for Patients:  Fact Sheet for  Healthcare Providers:  https://www.young.biz/ This test is not yet ap proved or cleared by the [place] and  has been authorized for detection and/or diagnosis of SARS-CoV-2 by FDA under an Emergency Use Authorization (EUA). This EUA will remain  in effect (meaning this test can be used) for the duration of the COVID-19 declaration under Section 564(b)(1) of the Act, 21 U.S.C. section 360bbb-3(b)(1), unless the authorization is terminated or revoked sooner.    Influenza A by PCR NEGATIVE NEGATIVE Final   Influenza B by PCR NEGATIVE NEGATIVE Final    Comment: (NOTE) The Xpert Xpress SARS-CoV-2/FLU/RSV assay is intended as an aid in  the diagnosis of influenza from Nasopharyngeal swab specimens and  should not be used as  a sole basis for treatment. Nasal washings and  aspirates are unacceptable for Xpert Xpress SARS-CoV-2/FLU/RSV  testing. Fact Sheet for Patients: https://www.moore.com/ Fact Sheet for Healthcare Providers: https://www.young.biz/ This test is not yet approved or cleared by the [place] FDA and  has been authorized for detection and/or diagnosis of SARS-CoV-2 by  FDA under an Emergency Use Authorization (EUA). This EUA will remain  in effect (meaning this test can be used) for the duration of the  Covid-19 declaration under Section 564(b)(1) of the Act, 21  U.S.C. section 360bbb-3(b)(1), unless the authorization is  terminated or revoked. Performed at [facility], [address], [place], [place] [address]   Urine culture     Status: None   Collection Time: 10/14/19  7:51 AM   Specimen: Urine, Random  Result Value Ref Range Status   Specimen Description URINE, RANDOM  Final   Special Requests NONE  Final   Culture   Final    NO GROWTH Performed at [facility], [address] [place], [place] [address]    Report Status 10/15/2019 FINAL  Final      Studies: Overnight EEG with video  Result Date: 10/14/2019 Charlsie Quest, MD     10/15/2019  2:47 PM Patient Name: Greg Cratty MRN: [id number] Epilepsy Attending: Charlsie Quest Referring Physician/Provider: [name], PA Duration: 10/13/2019 1543 to 1750 10/14/2019 0953 to 10/15/2019 0753  Patient history: 86 year old male presented as a stroke alert with left-sided gaze preference, nonverbal and right-sided weakness.  EEG to evaluate for status epilepticus.  Level of alertness: Lethargic, asleep  AEDs during EEG study: VPA, Vimpat  Technical aspects: This EEG study was done with scalp electrodes positioned according to the 10-20 International system of electrode placement. Electrical activity was acquired at a sampling rate of 500Hz  and reviewed with a high frequency  filter of 70Hz  and a low frequency filter of 1Hz . EEG data were recorded continuously and digitally stored.  Description: During awake state, no clear posterior dominant rhythm was seen.  Sleep was characterized by vertex waves, sleep spindles (12 to 15 Hz), maximal frontocentral.  EEG showed continuous generalized 3 to 5 Hz theta-delta slowing.  Frequent sharp waves were seen in left frontotemporal region, maximal F7, at times rhythmic without any clear evolution. Hyperventilation and photic stimulation were not performed.  Abnormality -Continuous slow, generalized -Sharp wave, left frontotemporal region  IMPRESSION: This study showed evidence of epileptogenicity in the left frontotemporal region.  Additionally there is evidence of moderate diffuse encephalopathy, nonspecific etiology. No seizures were seen throughout the recording. Priyanka    Scheduled Meds: .  stroke: mapping our early stages of recovery book   Does not apply Once  . aspirin EC  81  mg Oral Daily  . divalproex  1,000 mg Oral Daily  . heparin  5,000 Units Subcutaneous Q12H    Continuous Infusions: . lacosamide (VIMPAT) IV 100 mg (10/15/19 1153)     LOS: 2 days     Briant Cedar, MD [facility]  If 7PM-7AM, please contact night-coverage [email or web address] 10/15/2019, 5:22 PM

## 2019-10-15 NOTE — Progress Notes (Signed)
EEG electrodes removed- LTM study complete.  No skin breakdown noted. 

## 2019-10-15 NOTE — Progress Notes (Signed)
SLP Cancellation Note   Speech Language Pathology Treatment: Dysphagia  Patient Details Name: Seth Madden MRN: [id number] DOB: 1931/03/23 Today's Date: 10/15/2019 Time: 0867-6195 SLP Time Calculation (min) (ACUTE ONLY): 38 min  Assessment / Plan / Recommendation Clinical Impression  Pt did not seal lips on oral suction or toothette - his mentation remains severely impaired, SLP provided oral care with toothette and oral suction - removed dried blood from hard and soft palate.  Noted pt has no gag response = which is a different neuro tract than swallowing however it is notable.  Pt did not swallow despite today cues to do so.  After oral care, pt noted to cough copiously - with clearance of viscous, blood tinged secretions - presumed from pharynx.  SLP encouraged him to cough "hock" to clear pharynx and he continued to do so without expectoration of secretions.  Given pt has some perseveration, uncertain if continued posturing due to sensation of retention in pharynx or due to perseveration.  SLP requested RN presence and obtain a vitamat to check oxygen saturation.  Pt's oxygen saturations were in the 90s and he calmed after several minutes.  Again following total cues, he did not swallow.  At this time, continued mentation and concern for secretion management prohibit this pt from consuming po intake.  Sign posted in room, RN and pt educated.  Will follow up next date 10/16/2019    HPI HPI: 86 yo male adm to [facility] with AMS - breakthrough seizures vs strokes.  MRI showed probable hydrocephalus.  Pt EEG showed left frontao parietal epiptogenic diffuse encephalopathy.  Pt has PMH of remote CVA approx 10 years ago.  Pt has h/o dysphagia per chart review.  Swallow and speech evaluations ordered.      SLP Plan  Continue with current plan of care       Recommendations  Diet recommendations: NPO(frequent oral care with water via toothette to faciliate secretion management) Medication  Administration: Via alternative means                Oral Care Recommendations: Oral care QID SLP Visit Diagnosis: Dysphagia, oropharyngeal phase (R13.12) Plan: Continue with current plan of care       GO                Seth Madden 10/15/2019, 10:23 AM   Rolena Infante, MS [facility] SLP Acute Rehab Services Office [phone]

## 2019-10-15 NOTE — Procedures (Signed)
Patient Name:Seth Madden QMK:[id number] Epilepsy Attending:Gisele Pack Annabelle Harman Referring Physician/Provider:David Katrinka Blazing, [place] Duration:10/15/2019 8677 to 10/15/2019 1038  Patient history:86 year old male presented as a stroke alert with left-sided gaze preference, nonverbal and right-sided weakness. EEG to evaluate for status epilepticus.  Level of alertness:Lethargic, asleep  AEDs during EEG study:VPA, Vimpat  Technical aspects: This EEG study was done with scalp electrodes positioned according to the 10-20 International system of electrode placement. Electrical activity was acquired at a sampling rate of 500Hz  and reviewed with a high frequency filter of 70Hz  and a low frequency filter of 1Hz . EEG data were recorded continuously and digitally stored.  Description: During awake state, no clear posterior dominant rhythm was seen.  Sleep was characterized by vertex waves, sleep spindles (12 to 15 Hz), maximal frontocentral.  EEG showed continuous generalized 3 to 5 Hz theta-delta slowing admixed with 13 to 15 Hz generalized beta activity.  Frequent sharp waves were seen in left frontotemporal region,maximal F7, at times rhythmic without any clear evolution.Hyperventilation and photic stimulation were not performed.  Abnormality - Sharp wave, left frontotemporal region - Continuous slow, generalized  - Excessive beta, generalized   IMPRESSION: This studyshowed evidence of epileptogenicity in the left frontotemporal region. Additionally there is evidence of moderate diffuse encephalopathy, nonspecific etiology. No seizures were seen throughout the recording.  [name] 

## 2019-10-15 NOTE — Progress Notes (Signed)
NEUROLOGY PROGRESS NOTE  Subjective: Patient has no complaints.  Continues to ask for water  Exam: Vitals:   10/15/19 0527 10/15/19 0914  BP: (!) 162/86 (!) 151/79  Pulse: 99 84  Resp: 19 18  Temp: 98 F (36.7 C) 97.9 F (36.6 C)  SpO2: 96% 95%    Physical Exam  Constitutional: Appears well-developed and well-nourished.  Psych: Affect appropriate to situation Eyes: No scleral injection HENT: No OP obstrucion Head: Normocephalic.  Cardiovascular: Normal rate and regular rhythm.  Respiratory: Effort normal, non-labored breathing GI: Soft.  No distension. There is no tenderness.  Skin: WDI   Neuro:  Mental Status: Alert, oriented to hospital only.  Speech fluent without evidence of aphasia.  Able to follow simple step commands without difficulty. Cranial Nerves: II:  Visual fields grossly normal,  III,IV, VI: ptosis not present, extra-ocular motions intact bilaterally pupils equal, round, reactive to light and accommodation V,VII: Right facial droop  VIII: hearing normal bilaterally Motor: Has bilateral upper extremity restraints however moving all extremities antigravity Deep Tendon Reflexes: 2+ and symmetric throughout Cerebellar: normal finger-to-nose,  Medications:  Scheduled: .  stroke: mapping our early stages of recovery book   Does not apply Once  . aspirin EC  81 mg Oral Daily  . divalproex  1,000 mg Oral Daily  . heparin  5,000 Units Subcutaneous Q12H   Continuous: . lacosamide (VIMPAT) IV 100 mg (10/14/19 2120)    Pertinent Labs/Diagnostics: VPA level 60 LDL 46 A1c 5.7  EEG  Result Date: 10/13/2019  IMPRESSION: This study showed evidence of epileptogenic city in the left frontotemporal region.  Additionally there is evidence of moderate diffuse encephalopathy, nonspecific etiology. No seizures were seen throughout the recording.    MR BRAIN WO CONTRAST  Result Date: 10/13/2019 IMPRESSION: 1. Significantly limited study due to motion artifact.  2. No acute intracranial abnormality identified. 3. Prominently dilated supratentorial ventricles with associated bowing of corpus callosum and crowding of parietal sulci may represent normal pressure hydrocephalus in the appropriate clinical scenario. Electronically Signed   By: Pedro Earls M.D.   On: 10/13/2019 14:51   Overnight EEG with video  Result Date: 10/14/2019 IMPRESSION: This study showed evidence of moderate diffuse encephalopathy, nonspecific etiology. No seizures or definite epileptiform discharges were seen throughout the recording. Lora Havens   ECHOCARDIOGRAM COMPLETE  Result Date: 10/14/2019 IMPRESSIONS  1. Technically challengind study, limited windows/images in all views. Normal LVEF without apparent wall motion abnormalities. No hemodynamically significant valve disease. No clear shunting.  2. Left ventricular ejection fraction, by estimation, is 60 to 65%.  Etta Quill PA-C [facility] [phone]   Impression: 86 year old male with history of seizure presented to the emergency room with right hemiplegia and aphasia.  Vascular imaging showed no clear cause.  MRI did not show any acute stroke, EEG has not shown any seizure activity.  EEG did show left-sided sharp waves.  Current Depakote level is 60.     Recommendations: -Continue Vimpat at 100 mg twice daily -Continue Depakote at current dose, will rechekc level again tomorrow given how much is has fallen.  -We will need to follow-up with neurology as an outpatient -Per [facility] statutes, patients with seizures are not allowed to drive until  they have been seizure-free for six months. Use caution when using heavy equipment or power tools. Avoid working on ladders or at heights. Take showers instead of baths. Ensure the water temperature is not too high on the home water heater. Do  not go swimming alone. When caring for infants or small children, sit down when holding, feeding,  or changing them to minimize risk of injury to the child in the event you have a seizure.   Also, Maintain good sleep hygiene. Avoid alcohol.   [name], MD [facility] [phone]  If 7pm- 7am, please page neurology on call as listed in AMION. 10/15/2019, 9:37 AM

## 2019-10-16 ENCOUNTER — Inpatient Hospital Stay (HOSPITAL_COMMUNITY): Payer: Medicare Other

## 2019-10-16 ENCOUNTER — Encounter (HOSPITAL_COMMUNITY): Payer: Self-pay

## 2019-10-16 ENCOUNTER — Other Ambulatory Visit: Payer: Self-pay

## 2019-10-16 LAB — CBC WITH DIFFERENTIAL/PLATELET
Abs Immature Granulocytes: 0.03 10*3/uL (ref 0.00–0.07)
Basophils Absolute: 0.1 10*3/uL (ref 0.0–0.1)
Basophils Relative: 1 %
Eosinophils Absolute: 0 10*3/uL (ref 0.0–0.5)
Eosinophils Relative: 0 %
HCT: 37.9 % — ABNORMAL LOW (ref 39.0–52.0)
Hemoglobin: 12.2 g/dL — ABNORMAL LOW (ref 13.0–17.0)
Immature Granulocytes: 0 %
Lymphocytes Relative: 17 %
Lymphs Abs: 1.3 10*3/uL (ref 0.7–4.0)
MCH: 29 pg (ref 26.0–34.0)
MCHC: 32.2 g/dL (ref 30.0–36.0)
MCV: 90.2 fL (ref 80.0–100.0)
Monocytes Absolute: 1 10*3/uL (ref 0.1–1.0)
Monocytes Relative: 14 %
Neutro Abs: 4.9 10*3/uL (ref 1.7–7.7)
Neutrophils Relative %: 68 %
Platelets: 122 10*3/uL — ABNORMAL LOW (ref 150–400)
RBC: 4.2 MIL/uL — ABNORMAL LOW (ref 4.22–5.81)
RDW: 13 % (ref 11.5–15.5)
WBC: 7.2 10*3/uL (ref 4.0–10.5)
nRBC: 0 % (ref 0.0–0.2)

## 2019-10-16 LAB — BASIC METABOLIC PANEL
Anion gap: 13 (ref 5–15)
BUN: 24 mg/dL — ABNORMAL HIGH (ref 8–23)
CO2: 25 mmol/L (ref 22–32)
Calcium: 8.8 mg/dL — ABNORMAL LOW (ref 8.9–10.3)
Chloride: 111 mmol/L (ref 98–111)
Creatinine, Ser: 1.43 mg/dL — ABNORMAL HIGH (ref 0.61–1.24)
GFR calc Af Amer: 51 mL/min — ABNORMAL LOW (ref >60)
GFR calc non Af Amer: 44 mL/min — ABNORMAL LOW (ref >60)
Glucose, Bld: 100 mg/dL — ABNORMAL HIGH (ref 70–99)
Potassium: 3.6 mmol/L (ref 3.5–5.1)
Sodium: 149 mmol/L — ABNORMAL HIGH (ref 135–145)

## 2019-10-16 LAB — VALPROIC ACID LEVEL: Valproic Acid Lvl: 92 ug/mL (ref 50.0–100.0)

## 2019-10-16 LAB — LEVETIRACETAM LEVEL: Levetiracetam Lvl: <1 ug/mL — ABNORMAL LOW (ref 10.0–40.0)

## 2019-10-16 MED ORDER — DIVALPROEX SODIUM 125 MG PO CSDR: 500.0000 mg | DELAYED_RELEASE_CAPSULE | Freq: Two times a day (BID) | ORAL | Status: DC

## 2019-10-16 MED ORDER — DEXTROSE-NACL 5-0.45 % IV SOLN: Freq: Once | INTRAVENOUS | Status: AC

## 2019-10-16 MED ORDER — DIVALPROEX SODIUM 125 MG PO CSDR
375.0000 mg | DELAYED_RELEASE_CAPSULE | Freq: Two times a day (BID) | ORAL | Status: DC
  Administered 2019-10-16 – 2019-10-19 (×6): 375 mg via ORAL
  Filled 2019-10-16 (×6): qty 3

## 2019-10-16 MED ORDER — SODIUM CHLORIDE 0.9 % IV SOLN
150.0000 mg | Freq: Two times a day (BID) | INTRAVENOUS | Status: DC
  Administered 2019-10-16 – 2019-10-19 (×6): 150 mg via INTRAVENOUS
  Filled 2019-10-16 (×7): qty 15

## 2019-10-16 NOTE — Plan of Care (Signed)
  Problem: Education: Goal: Knowledge of secondary prevention will improve Outcome: Progressing Goal: Knowledge of patient specific risk factors addressed and post discharge goals established will improve Outcome: Progressing   Problem: Education: Goal: Knowledge of General Education information will improve Description: Including pain rating scale, medication(s)/side effects and non-pharmacologic comfort measures Outcome: Progressing   Problem: Health Behavior/Discharge Planning: Goal: Ability to manage health-related needs will improve Outcome: Progressing   Problem: Clinical Measurements: Goal: Ability to maintain clinical measurements within normal limits will improve Outcome: Progressing Goal: Will remain free from infection Outcome: Progressing Goal: Diagnostic test results will improve Outcome: Progressing Goal: Respiratory complications will improve Outcome: Progressing Goal: Cardiovascular complication will be avoided Outcome: Progressing   Problem: Activity: Goal: Risk for activity intolerance will decrease Outcome: Progressing   Problem: Nutrition: Goal: Adequate nutrition will be maintained Outcome: Progressing   Problem: Coping: Goal: Level of anxiety will decrease Outcome: Progressing   Problem: Elimination: Goal: Will not experience complications related to bowel motility Outcome: Progressing Goal: Will not experience complications related to urinary retention Outcome: Progressing   Problem: Pain Managment: Goal: General experience of comfort will improve Outcome: Progressing   Problem: Safety: Goal: Ability to remain free from injury will improve Outcome: Progressing   Problem: Skin Integrity: Goal: Risk for impaired skin integrity will decrease Outcome: Progressing   Problem: Education: Goal: Expressions of having a comfortable level of knowledge regarding the disease process will increase Outcome: Progressing   Problem: Coping: Goal:  Ability to adjust to condition or change in health will improve Outcome: Progressing Goal: Ability to identify appropriate support needs will improve Outcome: Progressing   Problem: Health Behavior/Discharge Planning: Goal: Compliance with prescribed medication regimen will improve Outcome: Progressing   Problem: Medication: Goal: Risk for medication side effects will decrease Outcome: Progressing   Problem: Clinical Measurements: Goal: Complications related to the disease process, condition or treatment will be avoided or minimized Outcome: Progressing Goal: Diagnostic test results will improve Outcome: Progressing   Problem: Safety: Goal: Verbalization of understanding the information provided will improve Outcome: Progressing   Problem: Self-Concept: Goal: Level of anxiety will decrease Outcome: Progressing Goal: Ability to verbalize feelings about condition will improve Outcome: Progressing   Problem: Education: Goal: Knowledge of secondary prevention will improve Outcome: Progressing Goal: Knowledge of patient specific risk factors addressed and post discharge goals established will improve Outcome: Progressing Goal: Individualized Educational Video(s) Outcome: Progressing   Problem: Education: Goal: Ability to incorporate positive changes in behavior to improve self-esteem will improve Outcome: Progressing   Problem: Health Behavior/Discharge Planning: Goal: Ability to remain free from injury will improve Outcome: Progressing   

## 2019-10-16 NOTE — Progress Notes (Addendum)
Physical Therapy Treatment Patient Details Name: Seth Madden MRN: [id number] DOB: 09-24-1930 Today's Date: 10/16/2019    History of Present Illness Seth Madden is a 86 y.o. male with medical history significant CVA, HTN, HLD, seizure, bilateral carotid stenosis presented with right-sided weakness and AMS. MRI with no acute intracranial abnormality although limited study due to motion artifact. CTA negative for emergent LVO. Work up pending for possible breakthrough seizure/status epilepticus.     PT Comments    Pt progressing slowly towards physical therapy goals. Pt is able to follow more one step commands today and no perseveration noted. Ambulating 6 feet with a walker and two person moderate assist. Demonstrates right sided weakness, inattention, balance impairments, cognitive deficits. Continue to recommend SNF for ongoing Physical Therapy.      Follow Up Recommendations  SNF;Supervision/Assistance - 24 hour     Equipment Recommendations  3in1 (PT);Wheelchair (measurements PT)    Recommendations for Other Services       Precautions / Restrictions Precautions Precautions: Fall;Other (comment) Precaution Comments: Restraints Restrictions Weight Bearing Restrictions: No    Mobility  Bed Mobility Overal bed mobility: Needs Assistance Bed Mobility: Supine to Sit;Sit to Supine;Rolling Rolling: Max assist;+2 for physical assistance   Supine to sit: Max assist;+2 for physical assistance Sit to supine: Max assist;+2 for physical assistance   General bed mobility comments: Pt requiring maxA + 2 for all aspects of bed mobility. Rolling initially to right and left for peri care; pt confused by task and requiring increased assist. Assist for trunk and leg negotiation with supine <> sit  Transfers Overall transfer level: Needs assistance Equipment used: Rolling walker (2 wheeled) Transfers: Sit to/from Stand Sit to Stand: Min assist;+2 physical assistance          General transfer comment: MinA + 2 to stand from elevated bed height  Ambulation/Gait Ambulation/Gait assistance: Mod assist;+2 physical assistance Gait Distance (Feet): 6 Feet Assistive device: Rolling walker (2 wheeled) Gait Pattern/deviations: Step-through pattern;Decreased step length - right;Decreased stance time - right;Decreased dorsiflexion - right;Trunk flexed Gait velocity: decreased Gait velocity interpretation: <1.31 ft/sec, indicative of household ambulator General Gait Details: Pt requiring modA + 2 for stability, max cues for anterior progression of RLE, keeping it on inside of walker, and walker proximity. Occasionally requiring manual assist for anterior progression of RLE. Increased difficulty with turning.   Stairs             Wheelchair Mobility    Modified Rankin (Stroke Patients Only)       Balance Overall balance assessment: Needs assistance Sitting-balance support: Feet supported Sitting balance-Leahy Scale: Poor Sitting balance - Comments: Right lateral and posterior lean requiring mod-maxA to correct   Standing balance support: Bilateral upper extremity supported Standing balance-Leahy Scale: Poor Standing balance comment: reliant on external support                            Cognition Arousal/Alertness: Awake/alert Behavior During Therapy: Flat affect Overall Cognitive Status: Impaired/Different from baseline Area of Impairment: Attention;Following commands;Awareness                   Current Attention Level: Focused   Following Commands: Follows one step commands inconsistently   Awareness: Intellectual   General Comments: Pt with no perseveration this session, following ~25-50% of 1 step commands but is easily distractable and needs repetition for task completion      Exercises General Exercises - Lower Extremity Long Arc Quad: Both;Seated;5  reps    General Comments        Pertinent Vitals/Pain Pain  Assessment: Faces Faces Pain Scale: No hurt    Home Living                      Prior Function            PT Goals (current goals can now be found in the care plan section) Acute Rehab PT Goals Patient Stated Goal: unable Potential to Achieve Goals: Fair Progress towards PT goals: Progressing toward goals    Frequency    Min 2X/week      PT Plan Frequency needs to be updated    Co-evaluation              AM-PAC PT "6 Clicks" Mobility   Outcome Measure  Help needed turning from your back to your side while in a flat bed without using bedrails?: Total Help needed moving from lying on your back to sitting on the side of a flat bed without using bedrails?: Total Help needed moving to and from a bed to a chair (including a wheelchair)?: A Lot Help needed standing up from a chair using your arms (e.g., wheelchair or bedside chair)?: A Little Help needed to walk in hospital room?: A Lot Help needed climbing 3-5 steps with a railing? : Total 6 Click Score: 10    End of Session Equipment Utilized During Treatment: Gait belt Activity Tolerance: Patient tolerated treatment well Patient left: with call bell/phone within reach;in bed;with bed alarm set;with restraints reapplied Nurse Communication: Mobility status PT Visit Diagnosis: Other abnormalities of gait and mobility (R26.89);Muscle weakness (generalized) (M62.81)     Time: 8295-6213 PT Time Calculation (min) (ACUTE ONLY): 28 min  Charges:  $Gait Training: 8-22 mins $Therapeutic Activity: 8-22 mins                       Wyona Almas, PT, DPT Acute Rehabilitation Services Pager [phone] Office [phone]    Deno Etienne 10/16/2019, 3:46 PM

## 2019-10-16 NOTE — NC FL2 (Signed)
[place] MEDICAID FL2 LEVEL OF CARE SCREENING TOOL     IDENTIFICATION  Patient Name: Seth Madden Birthdate: [birth year] Sex: male Admission Date (Current Location): 10/13/2019  [facility] and [place] Number:  Herbalist and Address:  [facility] [place] [address] [place], [place] [address]      Provider Number: [id number]  Attending Physician Name and Address:  Alma Friendly, MD  Relative Name and Phone Number:       Current Level of Care: Hospital Recommended Level of Care: [place] Prior Approval Number:    Date Approved/Denied:   PASRR Number: [id number]  Discharge Plan: SNF    Current Diagnoses: Patient Active Problem List   Diagnosis Date Noted  . Stroke (cerebrum) ([place]) 10/13/2019  . CVA (cerebral vascular accident) ([place]) 10/13/2019  . Stroke-like symptoms   . Altered mental status   . Essential hypertension 11/25/2017  . Hyperlipidemia 11/25/2017  . Recurrent seizures ([place]) 11/24/2017    Orientation RESPIRATION BLADDER Height & Weight     Self  Normal Incontinent Weight: 117 lb 15.1 oz (53.5 kg) Height:  5\' 8"  (172.7 cm)  BEHAVIORAL SYMPTOMS/MOOD NEUROLOGICAL BOWEL NUTRITION STATUS      Incontinent Diet(See discharge summery)  AMBULATORY STATUS COMMUNICATION OF NEEDS Skin   Total Care Verbally Normal                       Personal Care Assistance Level of Assistance  Bathing, Feeding, Dressing Bathing Assistance: Maximum assistance Feeding assistance: Maximum assistance Dressing Assistance: Maximum assistance     Functional Limitations Info             SPECIAL CARE FACTORS FREQUENCY  PT (By licensed PT), OT (By licensed OT)     PT Frequency: 5x a week OT Frequency: 5x a week            Contractures Contractures Info: Not present    Additional Factors Info  Code Status, Allergies Code Status Info: DNR Allergies Info: NKA           Current Medications  (10/16/2019):  This is the current hospital active medication list Current Facility-Administered Medications  Medication Dose Route Frequency Provider Last Rate Last Admin  .  stroke: mapping our early stages of recovery book   Does not apply Once Wynetta Fines T, MD      . acetaminophen (TYLENOL) tablet 650 mg  650 mg Oral Q4H PRN Lequita Halt, MD       Or  . acetaminophen (TYLENOL) 160 MG/5ML solution 650 mg  650 mg Per Tube Q4H PRN Wynetta Fines T, MD       Or  . acetaminophen (TYLENOL) suppository 650 mg  650 mg Rectal Q4H PRN Wynetta Fines T, MD      . aspirin EC tablet 81 mg  81 mg Oral Daily Wynetta Fines T, MD   81 mg at 10/16/19 0917  . divalproex (DEPAKOTE ER) 24 hr tablet 1,000 mg  1,000 mg Oral Daily Wynetta Fines T, MD   1,000 mg at 10/16/19 0917  . heparin injection 5,000 Units  5,000 Units Subcutaneous Q12H Lequita Halt, MD   5,000 Units at 10/16/19 [phone]  . lacosamide (VIMPAT) 100 mg in sodium chloride 0.9 % 25 mL IVPB  100 mg Intravenous Q12H Marliss Coots, PA-C 70 mL/hr at 10/16/19 0942 100 mg at 10/16/19 0942  . senna-docusate (Senokot-S) tablet 1 tablet  1 tablet Oral QHS PRN  Emeline General, MD         Discharge Medications: Please see discharge summary for a list of discharge medications.  Relevant Imaging Results:  Relevant Lab Results:   Additional Information SSN [id number]  Jimmy Picket, [place]

## 2019-10-16 NOTE — Progress Notes (Signed)
Subjective: Continues to improve  Exam: Vitals:   10/16/19 0338 10/16/19 0848  BP: 140/70 (!) 142/74  Pulse: 76 75  Resp: 20 16  Temp: 98.6 F (37 C) 98.4 F (36.9 C)  SpO2: 98% 97%   Gen: In bed, NAD Resp: non-labored breathing, no acute distress Abd: soft, nt  Neuro: MS: Awake, alert, he still has some word finding difficulty but his aphasia is much improved, he is able to answer simple questions and follow commands readily. CN: He is able to count fingers in all 4 visual fields, extraocular movements are intact Motor: His right hemiparesis is markedly improved with 4+/5 strength at this point. Sensory: He endorses symmetric sensation to light touch.  Pertinent Labs: VPA level of 60 yesterday  Impression: 86 year old male who has had recurrent episodes of right-sided hemiplegia and aphasia with negative imaging.  Though we did not capture any seizures on EEG, we did capture sharp waves which were seen soon after arrival, which I do think continues to support the possibility of seizure as an etiology for his recurrent presentations.  The prolonged symptomatic.  Without ongoing seizure is unusual, but at this point I think that this is the most likely explanation.  His level of Depakote was falling rapidly, but does not appear that he was getting it due to n.p.o. status and lack of IV Depacon due to national shortage.  He was on 1000 mg a day as an outpatient, and therefore I suspect that this is actually too much and therefore we will decrease his dose to 375 twice daily.  He will need repeat labs in a week or so.  At the same time, given that breakthrough seizures most likely etiology, I would increase his Vimpat from his home dose of 100 twice daily to 150 twice daily.  With him improving, I expect him to continue to do so.  Recommendations: 1) increase Vimpat to 150 twice daily, once he is taking p.o. this can be changed to p.o. 150 mg twice daily 2) Depakote sprinkle 375  twice daily 3) repeat Depakote level in 1 week 4) from this point forward, I do not have any further neurodiagnostic testing, neurology will be available on an as-needed basis moving forward.  [name], MD [facility] [phone]  If 7pm- 7am, please page neurology on call as listed in AMION.

## 2019-10-16 NOTE — Progress Notes (Signed)
PROGRESS NOTE  Seth Madden IHK:[id number] DOB: Sep 24, 1930 DOA: 10/13/2019 PCP: Seth Christians, MD  HPI/Recap of past 24 hours: HPI from Dr Seth Madden is a 86 y.o. male with medical history significant CVA, HTN, HLD, bilateral carotid stenosis presented with right-sided weakness. Patient daughter who lives with patient reported that patient was last seen normal at 1830 on 10/12/2019. Daughter noticed that he had a left gaze preference and was not moving his right side, and called 911.  Patient has altered mental status nonverbal, most history was provided by patient's daughter and ED record.  According to patient daughter, patient received Cipro last week for UTI by PCP.  ED Course: Blood pressure was 128/80, heart rate 100-110s, CBG 128 and patient was nonvocal.  Neurology consulted.  Patient admitted for further management.    Today, continues to improve, able to answer simple questions asked, still disoriented to place, denies any chest pain, shortness of breath, abdominal pain, nausea/vomiting, fever/chills.      Assessment/Plan: Active Problems:   Stroke (cerebrum) (HCC)   CVA (cerebral vascular accident) ([place])   CVA ruled out MRI with no acute intracranial abnormality although limited study due to motion artifact, consider normal pressure hydrocephalus in the appropriate clinical scenario due to prominently dilated ventricles CTA head/neck/cerebral perfusion negative for emergent large vessel occlusion LDL 46, A1c 5.7 Echo with EF 60 to 65%, no regional wall motion abnormalities, no intracardiac source of embolus detected Neurology on board Frequent neuro checks, telemetry PT/OT/SLP-SLP recommends dysphagia 3 solids, nectar thick liquid  History of seizure disorder with possible breakthrough seizure/status epilepticus Recently received quinolones for UTI, ??lower seizure threshold, in addition to recent medication changes EEG with evidence of  epileptogenic city in the left frontotemporal region, moderate diffuse encephalopathy Overnight EEG which showed evidence of epileptogenic city in the left frontotemporal region, with evidence of moderate diffuse encephalopathy, nonspecific etiology.  No seizures were seen throughout the recording Neurology on board, recommend increase Vimpat to 150 mg twice daily, Depakote sprinkles 375 twice daily Continue Vimpat, Depakote Seizure precautions  Acute metabolic encephalopathy Likely due to above Management as above  AKI on CKD stage IIIa Improving  Continue IV fluids Daily BMP       Malnutrition Type:      Malnutrition Characteristics:      Nutrition Interventions:       Estimated body mass index is 17.93 kg/m as calculated from the following:   Height as of this encounter: 5\' 8"  (1.727 m).   Weight as of this encounter: 53.5 kg.     Code Status: DNR  Family Communication: None at bedside  Disposition Plan: Likely SNF   Consultants:  Neurology  Procedures:  24-hour EEG  Antimicrobials:  None  DVT prophylaxis: Heparin   Objective: Vitals:   10/16/19 0338 10/16/19 0848 10/16/19 1115 10/16/19 1559  BP: 140/70 (!) 142/74 132/79 131/82  Pulse: 76 75 72 82  Resp: 20 16 16 20   Temp: 98.6 F (37 C) 98.4 F (36.9 C) 97.8 F (36.6 C) 99.3 F (37.4 C)  TempSrc: Oral Oral Oral Oral  SpO2: 98% 97% 97% 96%  Weight:      Height:        Intake/Output Summary (Last 24 hours) at 10/16/2019 1645 Last data filed at 10/16/2019 1110 Gross per 24 hour  Intake --  Output 600 ml  Net -600 ml   Filed Weights   10/13/19 2151  Weight: 53.5 kg    Exam:  General: NAD  Cardiovascular: S1, S2 present  Respiratory: CTAB  Abdomen: Soft, nontender, nondistended, bowel sounds present  Musculoskeletal: No bilateral pedal edema noted  Skin: Normal  Psychiatry: Stable mood  Neurology: No obvious focal neurologic deficits noted    Data  Reviewed: CBC: Recent Labs  Lab 10/13/19 0856 10/13/19 0900 10/14/19 0751 10/15/19 0223 10/16/19 0455  WBC 12.7*  --  8.6 8.0 7.2  NEUTROABS 10.9*  --  6.8 5.4 4.9  HGB 12.9* 13.3 12.4* 12.2* 12.2*  HCT 41.4 39.0 38.0* 37.3* 37.9*  MCV 92.4  --  89.8 89.9 90.2  PLT 127*  --  126* 100* 122*   Basic Metabolic Panel: Recent Labs  Lab 10/13/19 0856 10/13/19 0900 10/14/19 0751 10/15/19 0223 10/16/19 0455  NA 142 143 146* 145 149*  K 4.0 4.0 3.7 4.0 3.6  CL 104 108 108 110 111  CO2 21*  --  24 21* 25  GLUCOSE 136* 131* 94 84 100*  BUN 18 18 19  27* 24*  CREATININE 2.22* 2.10* 1.54* 1.52* 1.43*  CALCIUM 9.0  --  8.8* 8.6* 8.8*   GFR: Estimated Creatinine Clearance: 28.1 mL/min (A) (by C-G formula based on SCr of 1.43 mg/dL (H)). Liver Function Tests: Recent Labs  Lab 10/13/19 0856  AST 32  ALT 16  ALKPHOS 83  BILITOT 0.8  PROT 8.1  ALBUMIN 4.1   No results for input(s): LIPASE, AMYLASE in the last 168 hours. No results for input(s): AMMONIA in the last 168 hours. Coagulation Profile: Recent Labs  Lab 10/13/19 0856  INR 1.3*   Cardiac Enzymes: No results for input(s): CKTOTAL, CKMB, CKMBINDEX, TROPONINI in the last 168 hours. BNP (last 3 results) No results for input(s): PROBNP in the last 8760 hours. HbA1C: Recent Labs    10/14/19 0751  HGBA1C 5.7*   CBG: Recent Labs  Lab 10/13/19 0855  GLUCAP 121*   Lipid Profile: Recent Labs    10/14/19 0751  CHOL 118  HDL 55  LDLCALC 46  TRIG 87  CHOLHDL 2.1   Thyroid Function Tests: No results for input(s): TSH, T4TOTAL, FREET4, T3FREE, THYROIDAB in the last 72 hours. Anemia Panel: No results for input(s): VITAMINB12, FOLATE, FERRITIN, TIBC, IRON, RETICCTPCT in the last 72 hours. Urine analysis:    Component Value Date/Time   COLORURINE YELLOW 10/13/2019 0425   APPEARANCEUR CLEAR 10/13/2019 0425   LABSPEC 1.032 (H) 10/13/2019 0425   PHURINE 6.0 10/13/2019 0425   GLUCOSEU NEGATIVE 10/13/2019 0425    HGBUR LARGE (A) 10/13/2019 0425   BILIRUBINUR NEGATIVE 10/13/2019 0425   KETONESUR 5 (A) 10/13/2019 0425   PROTEINUR 100 (A) 10/13/2019 0425   NITRITE NEGATIVE 10/13/2019 0425   LEUKOCYTESUR NEGATIVE 10/13/2019 0425   Sepsis Labs: @LABRCNTIP (procalcitonin:4,lacticidven:4)  ) Recent Results (from the past 240 hour(s))  Respiratory Panel by RT PCR (Flu A&B, Covid) - Nasopharyngeal Swab     Status: None   Collection Time: 10/13/19  9:27 AM   Specimen: Nasopharyngeal Swab  Result Value Ref Range Status   SARS Coronavirus 2 by RT PCR NEGATIVE NEGATIVE Final    Comment: (NOTE) SARS-CoV-2 target nucleic acids are NOT DETECTED. The SARS-CoV-2 RNA is generally detectable in upper respiratoy specimens during the acute phase of infection. The lowest concentration of SARS-CoV-2 viral copies this assay can detect is 131 copies/mL. A negative result does not preclude SARS-Cov-2 infection and should not be used as the sole basis for treatment or other patient management decisions. A negative result may occur with  improper specimen collection/handling,  submission of specimen other than nasopharyngeal swab, presence of viral mutation(s) within the areas targeted by this assay, and inadequate number of viral copies (<131 copies/mL). A negative result must be combined with clinical observations, patient history, and epidemiological information. The expected result is Negative. Fact Sheet for Patients:  https://www.moore.com/ Fact Sheet for Healthcare Providers:  https://www.young.biz/ This test is not yet ap proved or cleared by the [place] FDA and  has been authorized for detection and/or diagnosis of SARS-CoV-2 by FDA under an Emergency Use Authorization (EUA). This EUA will remain  in effect (meaning this test can be used) for the duration of the COVID-19 declaration under Section 564(b)(1) of the Act, 21 U.S.C. section 360bbb-3(b)(1), unless  the authorization is terminated or revoked sooner.    Influenza A by PCR NEGATIVE NEGATIVE Final   Influenza B by PCR NEGATIVE NEGATIVE Final    Comment: (NOTE) The Xpert Xpress SARS-CoV-2/FLU/RSV assay is intended as an aid in  the diagnosis of influenza from Nasopharyngeal swab specimens and  should not be used as a sole basis for treatment. Nasal washings and  aspirates are unacceptable for Xpert Xpress SARS-CoV-2/FLU/RSV  testing. Fact Sheet for Patients: https://www.moore.com/ Fact Sheet for Healthcare Providers: https://www.young.biz/ This test is not yet approved or cleared by the [place] FDA and  has been authorized for detection and/or diagnosis of SARS-CoV-2 by  FDA under an Emergency Use Authorization (EUA). This EUA will remain  in effect (meaning this test can be used) for the duration of the  Covid-19 declaration under Section 564(b)(1) of the Act, 21  U.S.C. section 360bbb-3(b)(1), unless the authorization is  terminated or revoked. Performed at [facility], [address], [place], [place] [address]   Urine culture     Status: None   Collection Time: 10/14/19  7:51 AM   Specimen: Urine, Random  Result Value Ref Range Status   Specimen Description URINE, RANDOM  Final   Special Requests NONE  Final   Culture   Final    NO GROWTH Performed at [facility], [address] [place], [place] [address]    Report Status 10/15/2019 FINAL  Final      Studies: DG Swallowing Func-Speech Pathology  Result Date: 10/16/2019 Objective Swallowing Evaluation: Type of Study: MBS-Modified Barium Swallow Study  Patient Details Name: Seth Madden MRN: [id number] Date of Birth: June 10, 1931 Today's Date: 10/16/2019 Time: SLP Start Time (ACUTE ONLY): 0815 -SLP Stop Time (ACUTE ONLY): 0842 SLP Time Calculation (min) (ACUTE ONLY): 27 min Past Medical History: Past Medical History: Diagnosis Date . Bilateral carotid artery  stenosis  . Dysphagia  . Hyperlipidemia  . Hypertension  . Seizure (HCC)  . Stroke (HCC)  . UTI (urinary tract infection)  Past Surgical History: No past surgical history on file. HPI: 86 yo male adm to [facility] with AMS - breakthrough seizures vs strokes.  MRI showed probable hydrocephalus.  Pt EEG showed left frontao parietal epiptogenic diffuse encephalopathy.  Pt has PMH of remote CVA approx 10 years ago.  Pt has h/o dysphagia per chart review.  Swallow and speech evaluations ordered.  Subjective: pt awake in bed, left gaze preference ongoing Assessment / Plan / Recommendation CHL IP CLINICAL IMPRESSIONS 10/16/2019 Clinical Impression Pt presents with mild oropharyngeal dysphagia c/b decreased oral control with resultant premature spillage of boluses and oral retention.  Pt did not orally transit barium tablet with pudding x2 attempts. Pharyngeal swallow c/b delay in swallow with liquids pooling extensively in pyriform sinus  prior to swallow.  NO aspiration or penetration noted but pt is impulsuive and his cough is weak thus recommend conservative diet to mitigate his risk.  Would allow thin water between meals after oral care for his comfort and hygeine.  Anticipate pt will be able to advance diet to allow thin liquids as medically improves  - dys3 will likely be advised for him long term given his dysarthria and ill fitting dentures.  Of note, pt did clear his throat during MBS with no barium visualized in larynx/trachea.  Using teach back with live monitor, education completed during session.  SLP Visit Diagnosis Dysphagia, oropharyngeal phase (R13.12) Attention and concentration deficit following -- Frontal lobe and executive function deficit following -- Impact on safety and function Moderate aspiration risk   CHL IP TREATMENT RECOMMENDATION 10/16/2019 Treatment Recommendations Therapy as outlined in treatment plan below   Prognosis 10/16/2019 Prognosis for Safe Diet Advancement Good Barriers to Reach Goals  Cognitive deficits Barriers/Prognosis Comment -- CHL IP DIET RECOMMENDATION 10/16/2019 SLP Diet Recommendations Dysphagia 3 (Mech soft) solids;Nectar thick liquid;Free water protocol after oral care Liquid Administration via Cup;Straw Medication Administration Crushed with puree Compensations Small sips/bites;Slow rate Postural Changes --   CHL IP OTHER RECOMMENDATIONS 10/16/2019 Recommended Consults -- Oral Care Recommendations Oral care QID Other Recommendations Order thickener from pharmacy;Clarify dietary restrictions   CHL IP FOLLOW UP RECOMMENDATIONS 10/16/2019 Follow up Recommendations Skilled Nursing facility   [facility] IP FREQUENCY AND DURATION 10/16/2019 Speech Therapy Frequency (ACUTE ONLY) min 1 x/week Treatment Duration 1 week      CHL IP ORAL PHASE 10/16/2019 Oral Phase Impaired Oral - Pudding Teaspoon -- Oral - Pudding Cup -- Oral - Honey Teaspoon -- Oral - Honey Cup -- Oral - Nectar Teaspoon -- Oral - Nectar Cup Weak lingual manipulation;Premature spillage;Decreased bolus cohesion Oral - Nectar Straw -- Oral - Thin Teaspoon Weak lingual manipulation;Decreased bolus cohesion;Premature spillage Oral - Thin Cup Weak lingual manipulation;Premature spillage;Decreased bolus cohesion Oral - Thin Straw Weak lingual manipulation;Decreased bolus cohesion;Premature spillage Oral - Puree Weak lingual manipulation;Reduced posterior propulsion;Decreased bolus cohesion Oral - Mech Soft Weak lingual manipulation;Reduced posterior propulsion;Decreased bolus cohesion Oral - Regular -- Oral - Multi-Consistency -- Oral - Pill Weak lingual manipulation;Lingual/palatal residue Oral Phase - Comment pt did not transit tablet despite multiple attempts with pudding, pt expectorated barium tablet with SLP verbal cue  CHL IP PHARYNGEAL PHASE 10/16/2019 Pharyngeal Phase Impaired Pharyngeal- Pudding Teaspoon -- Pharyngeal -- Pharyngeal- Pudding Cup -- Pharyngeal -- Pharyngeal- Honey Teaspoon -- Pharyngeal -- Pharyngeal- Honey Cup --  Pharyngeal -- Pharyngeal- Nectar Teaspoon -- Pharyngeal -- Pharyngeal- Nectar Cup -- Pharyngeal Material does not enter airway Pharyngeal- Nectar Straw -- Pharyngeal -- Pharyngeal- Thin Teaspoon Delayed swallow initiation-pyriform sinuses Pharyngeal Material does not enter airway Pharyngeal- Thin Cup Delayed swallow initiation-pyriform sinuses Pharyngeal Material does not enter airway Pharyngeal- Thin Straw Delayed swallow initiation-pyriform sinuses Pharyngeal Material does not enter airway Pharyngeal- Puree WFL;Pharyngeal residue - valleculae Pharyngeal Material does not enter airway Pharyngeal- Mechanical Soft WFL;Pharyngeal residue - valleculae Pharyngeal Material does not enter airway Pharyngeal- Regular -- Pharyngeal -- Pharyngeal- Multi-consistency -- Pharyngeal -- Pharyngeal- Pill NT Pharyngeal -- Pharyngeal Comment delay in reflexive swallowing noted to pyriform sinus with thin essentially filling entire area before swallow, pt has large lateral channels which allows pooling but given pt's impulsivity and taking very large boluses, recommend nectar thick liquids with meals and thin water between meals  CHL IP CERVICAL ESOPHAGEAL PHASE 10/16/2019 Cervical Esophageal Phase WFL Pudding Teaspoon -- Pudding Cup --  Honey Teaspoon -- Honey Cup -- Nectar Teaspoon -- Nectar Cup -- Nectar Straw -- Thin Teaspoon -- Thin Cup -- Thin Straw -- Puree -- Mechanical Soft -- Regular -- Multi-consistency -- Pill -- Cervical Esophageal Comment -- [name] 10/16/2019, 9:06 AM  Rolena Infante, MS [facility] SLP Acute Rehab Services Office [phone]              Scheduled Meds: .  stroke: mapping our early stages of recovery book   Does not apply Once  . aspirin EC  81 mg Oral Daily  . divalproex  375 mg Oral Q12H  . heparin  5,000 Units Subcutaneous Q12H    Continuous Infusions: . lacosamide (VIMPAT) IV       LOS: 3 days     Briant Cedar, MD [facility]  If 7PM-7AM, please contact  night-coverage [email or web address] 10/16/2019, 4:45 PM

## 2019-10-16 NOTE — Progress Notes (Signed)
  Speech Language Pathology Treatment:    Patient Details Name: Seth Madden MRN: [id number] DOB: 1930-09-21 Today's Date: 10/16/2019 Time:  -     Assessment / Plan / Recommendation Clinical Impression  Pt demonstrating improved mentation, speech clarity and participation today.  Oral cavity xerostomic but no active bleeding.  With moderate cues pt able to seal lips on straw, spoon and elicit swallow.  Weak cough noted after initial intake that subsided.  Voice is clear today fortunately however swallow is marginally delayed and pt continues with weak volitional and reflexive cough.  Anticipate he will be appropriate for a modified diet today.    At this time, recommend proceed with MBS to determine least restrictive diet and possible dietary compensations.  SLP continually provided pt with ice chips during session and placed lower dentures to maxmize swallow ability in MBS.  Pt and RN informed and agreeable to plan.  Pt noted with bruising on left shoulder and left buccal region - he denies fall prior to admit.     HPI HPI: 86 yo male adm to [facility] with AMS - breakthrough seizures vs strokes.  MRI showed probable hydrocephalus.  Pt EEG showed left frontao parietal epiptogenic diffuse encephalopathy.  Pt has PMH of remote CVA approx 10 years ago.  Pt has h/o dysphagia per chart review.  Swallow and speech evaluations ordered.      SLP Plan  MBS       Recommendations  Diet recommendations: NPO Medication Administration: Via alternative means                Oral Care Recommendations: Oral care QID SLP Visit Diagnosis: Dysphagia, oropharyngeal phase (R13.12) Plan: MBS       GO                [name] 10/16/2019, 7:59 AM   Rolena Infante, MS [facility] SLP Acute Rehab Services Office [phone]

## 2019-10-16 NOTE — TOC Progression Note (Addendum)
Transition of Care [facility]) - Progression Note    Patient Details  Name: Seth Madden MRN: [id number] Date of Birth: April 07, 1931  Transition of Care [facility] CM/SW Contact  Baldemar Lenis, [place] Phone Number: 10/16/2019, 10:48 AM  Clinical Narrative:   CSW received a call back from patient's daughter, Seth Madden, that she had talked it over with her brother yesterday and they are now in agreement to pursue SNF for the patient. Patient has been at [facility] in the past and the family was really happy with the care there, would like to try to get him there again. If not, somewhere close to [facility]. CSW faxed out referral, will follow.  UPDATE 5:14 PM: CSW received call back from [facility] that they are able to take the patient for SNF, wondering if he will be able over the weekend. CSW coordinated with MD, who will evaluate stability tomorrow and then put a COVID test in for possible admit on Sunday, if able. Patient will need to be out of restraints for over 24 hours prior to DC, MD is aware of barrier to discharge. CSW to follow.    Expected Discharge Plan: Skilled Nursing Facility Barriers to Discharge: Continued Medical Work up  Expected Discharge Plan and Services Expected Discharge Plan: Skilled Nursing Facility In-house Referral: Clinical Social Work   Post Acute Care Choice: Home Health Living arrangements for the past 2 months: Single Family Home                                       Social Determinants of Health (SDOH) Interventions    Readmission Risk Interventions No flowsheet data found.

## 2019-10-16 NOTE — Progress Notes (Signed)
Modified Barium Swallow Progress Note  Patient Details  Name: Seth Madden MRN: [id number] Date of Birth: [birth year]  Today's Date: 10/16/2019  Modified Barium Swallow completed.  Full report located under Chart Review in the Imaging Section.  Brief recommendations include the following:  Clinical Impression  Pt presents with mild oropharyngeal dysphagia c/b decreased oral control with resultant premature spillage of boluses and oral retention.  Pt did not orally transit barium tablet with pudding x2 attempts.  Pharyngeal swallow c/b delay in swallow with liquids pooling extensively in pyriform sinus prior to swallow.  NO aspiration or penetration noted but pt is impulsuive and his cough is weak thus recommend conservative diet to mitigate his risk.  Would allow thin water between meals after oral care for his comfort and hygeine.  Anticipate pt will be able to advance diet to allow thin liquids as medically improves  - dys3 will likely be advised for him long term given his dysarthria and ill fitting dentures.  Using teach back with live monitor, education completed during session.    Swallow Evaluation Recommendations       SLP Diet Recommendations: Dysphagia 3 (Mech soft) solids;Nectar thick liquid;Free water protocol after oral care   Liquid Administration via: Cup;Straw   Medication Administration: Crushed with puree   Supervision: Patient able to self feed   Compensations: Small sips/bites;Slow rate       Oral Care Recommendations: Oral care QID   Other Recommendations: Order thickener from pharmacy;Clarify dietary restrictions    [name] 10/16/2019,9:04 AM

## 2019-10-17 LAB — CBC WITH DIFFERENTIAL/PLATELET
Abs Immature Granulocytes: 0.01 10*3/uL (ref 0.00–0.07)
Basophils Absolute: 0 10*3/uL (ref 0.0–0.1)
Basophils Relative: 1 %
Eosinophils Absolute: 0.1 10*3/uL (ref 0.0–0.5)
Eosinophils Relative: 1 %
HCT: 32.8 % — ABNORMAL LOW (ref 39.0–52.0)
Hemoglobin: 10.6 g/dL — ABNORMAL LOW (ref 13.0–17.0)
Immature Granulocytes: 0 %
Lymphocytes Relative: 18 %
Lymphs Abs: 0.9 10*3/uL (ref 0.7–4.0)
MCH: 29 pg (ref 26.0–34.0)
MCHC: 32.3 g/dL (ref 30.0–36.0)
MCV: 89.6 fL (ref 80.0–100.0)
Monocytes Absolute: 0.7 10*3/uL (ref 0.1–1.0)
Monocytes Relative: 14 %
Neutro Abs: 3.3 10*3/uL (ref 1.7–7.7)
Neutrophils Relative %: 66 %
Platelets: 110 10*3/uL — ABNORMAL LOW (ref 150–400)
RBC: 3.66 MIL/uL — ABNORMAL LOW (ref 4.22–5.81)
RDW: 12.9 % (ref 11.5–15.5)
WBC: 5.1 10*3/uL (ref 4.0–10.5)
nRBC: 0 % (ref 0.0–0.2)

## 2019-10-17 LAB — BASIC METABOLIC PANEL
Anion gap: 10 (ref 5–15)
BUN: 18 mg/dL (ref 8–23)
CO2: 27 mmol/L (ref 22–32)
Calcium: 8.1 mg/dL — ABNORMAL LOW (ref 8.9–10.3)
Chloride: 105 mmol/L (ref 98–111)
Creatinine, Ser: 1.42 mg/dL — ABNORMAL HIGH (ref 0.61–1.24)
GFR calc Af Amer: 51 mL/min — ABNORMAL LOW (ref >60)
GFR calc non Af Amer: 44 mL/min — ABNORMAL LOW (ref >60)
Glucose, Bld: 109 mg/dL — ABNORMAL HIGH (ref 70–99)
Potassium: 3 mmol/L — ABNORMAL LOW (ref 3.5–5.1)
Sodium: 142 mmol/L (ref 135–145)

## 2019-10-17 MED ORDER — RESOURCE THICKENUP CLEAR PO POWD
ORAL | Status: DC | PRN
  Filled 2019-10-17: qty 125

## 2019-10-17 MED ORDER — POTASSIUM CHLORIDE 10 MEQ/100ML IV SOLN
10.0000 meq | INTRAVENOUS | Status: AC
  Administered 2019-10-17 (×4): 10 meq via INTRAVENOUS
  Filled 2019-10-17 (×4): qty 100

## 2019-10-17 NOTE — Plan of Care (Addendum)
Plan of care reviewed with pt. Safety measures in reach. Alarms on. Pt resting with eyes closed. Restraints removed this am and documentation in flowsheet. Will continue to monitor pt. Call bell in reach.  Pt reswabbed for covid. Tolerating restraints off well.  Problem: Education: Goal: Knowledge of secondary prevention will improve Outcome: Progressing Goal: Knowledge of patient specific risk factors addressed and post discharge goals established will improve Outcome: Progressing   Problem: Education: Goal: Knowledge of General Education information will improve Description: Including pain rating scale, medication(s)/side effects and non-pharmacologic comfort measures Outcome: Progressing   Problem: Health Behavior/Discharge Planning: Goal: Ability to manage health-related needs will improve Outcome: Progressing   Problem: Clinical Measurements: Goal: Ability to maintain clinical measurements within normal limits will improve Outcome: Progressing Goal: Will remain free from infection Outcome: Progressing Goal: Diagnostic test results will improve Outcome: Progressing Goal: Respiratory complications will improve Outcome: Progressing Goal: Cardiovascular complication will be avoided Outcome: Progressing   Problem: Activity: Goal: Risk for activity intolerance will decrease Outcome: Progressing   Problem: Nutrition: Goal: Adequate nutrition will be maintained Outcome: Progressing   Problem: Coping: Goal: Level of anxiety will decrease Outcome: Progressing   Problem: Elimination: Goal: Will not experience complications related to bowel motility Outcome: Progressing Goal: Will not experience complications related to urinary retention Outcome: Progressing   Problem: Pain Managment: Goal: General experience of comfort will improve Outcome: Progressing   Problem: Safety: Goal: Ability to remain free from injury will improve Outcome: Progressing   Problem: Skin  Integrity: Goal: Risk for impaired skin integrity will decrease Outcome: Progressing   Problem: Education: Goal: Expressions of having a comfortable level of knowledge regarding the disease process will increase Outcome: Progressing   Problem: Coping: Goal: Ability to adjust to condition or change in health will improve Outcome: Progressing Goal: Ability to identify appropriate support needs will improve Outcome: Progressing   Problem: Health Behavior/Discharge Planning: Goal: Compliance with prescribed medication regimen will improve Outcome: Progressing   Problem: Medication: Goal: Risk for medication side effects will decrease Outcome: Progressing   Problem: Clinical Measurements: Goal: Complications related to the disease process, condition or treatment will be avoided or minimized Outcome: Progressing Goal: Diagnostic test results will improve Outcome: Progressing   Problem: Safety: Goal: Verbalization of understanding the information provided will improve Outcome: Progressing   Problem: Self-Concept: Goal: Level of anxiety will decrease Outcome: Progressing Goal: Ability to verbalize feelings about condition will improve Outcome: Progressing   Problem: Education: Goal: Knowledge of secondary prevention will improve Outcome: Progressing Goal: Knowledge of patient specific risk factors addressed and post discharge goals established will improve Outcome: Progressing Goal: Individualized Educational Video(s) Outcome: Progressing   Problem: Education: Goal: Ability to incorporate positive changes in behavior to improve self-esteem will improve Outcome: Progressing   Problem: Health Behavior/Discharge Planning: Goal: Ability to remain free from injury will improve Outcome: Progressing

## 2019-10-17 NOTE — Progress Notes (Signed)
PROGRESS NOTE  Seth Madden ZTI:[id number] DOB: [birth year] DOA: 10/13/2019 PCP: [name], MD  HPI/Recap of past 24 hours: HPI from Dr Lin [name] is a 86 y.o. male with medical history significant CVA, HTN, HLD, bilateral carotid stenosis presented with right-sided weakness. Patient daughter who lives with patient reported that patient was last seen normal at 1830 on 10/12/2019. Daughter noticed that he had a left gaze preference and was not moving his right side, and called 911.  Patient has altered mental status nonverbal, most history was provided by patient's daughter and ED record.  According to patient daughter, patient received Cipro last week for UTI by PCP.  ED Course: Blood pressure was 128/80, heart rate 100-110s, CBG 128 and patient was nonvocal.  Neurology consulted.  Patient admitted for further management.    Today, patient continues to improve slowly, still unable to engage in any meaningful conversation.  Almost back to baseline.  Removed bilateral ankle restraints, plan to remove bilateral wrist restraints and observe for 24 hours, if remained stable, discharge to SNF as patient has a bed available.     Assessment/Plan: Active Problems:   Stroke (cerebrum) (HCC)   CVA (cerebral vascular accident) ([place])   CVA ruled out MRI with no acute intracranial abnormality although limited study due to motion artifact, consider normal pressure hydrocephalus in the appropriate clinical scenario due to prominently dilated ventricles CTA head/neck/cerebral perfusion negative for emergent large vessel occlusion LDL 46, A1c 5.7 Echo with EF 60 to 65%, no regional wall motion abnormalities, no intracardiac source of embolus detected Neurology on board Frequent neuro checks, telemetry PT/OT/SLP-SLP recommends dysphagia 3 solids, nectar thick liquid  History of seizure disorder with possible breakthrough seizure/status epilepticus Recently received quinolones  for UTI, ??lower seizure threshold, in addition to recent medication changes EEG with evidence of epileptogenic city in the left frontotemporal region, moderate diffuse encephalopathy Overnight EEG which showed evidence of epileptogenic city in the left frontotemporal region, with evidence of moderate diffuse encephalopathy, nonspecific etiology.  No seizures were seen throughout the recording Neurology on board, recommend increase Vimpat to 150 mg twice daily, Depakote sprinkles 375 twice daily Continue Vimpat, Depakote Seizure precautions  Acute drop in hemoglobin Likely 2/2 hemodilution from IV fluids No obvious signs of bleeding Daily CBC  Hypokalemia Replace as needed  Acute metabolic encephalopathy Likely due to above Management as above  AKI on CKD stage IIIa Improving  S/P IV fluids Daily BMP       Malnutrition Type:      Malnutrition Characteristics:      Nutrition Interventions:       Estimated body mass index is 17.93 kg/m as calculated from the following:   Height as of this encounter: 5\' 8"  (1.727 m).   Weight as of this encounter: 53.5 kg.     Code Status: DNR  Family Communication: None at bedside  Disposition Plan: Likely SNF, once able to be off of restraints for 24 hours   Consultants:  Neurology  Procedures:  24-hour EEG  Antimicrobials:  None  DVT prophylaxis: Heparin   Objective: Vitals:   10/17/19 0027 10/17/19 0421 10/17/19 0805 10/17/19 1218  BP: 115/74 126/66 123/78 (!) 144/71  Pulse: 68 63 74 77  Resp: 19 18 18 18   Temp: 98.3 F (36.8 C) 98 F (36.7 C) 98.3 F (36.8 C) 98.9 F (37.2 C)  TempSrc: Oral Oral Oral Oral  SpO2: 97% 99% 98% 96%  Weight:      Height:  Intake/Output Summary (Last 24 hours) at 10/17/2019 1530 Last data filed at 10/17/2019 1221 Gross per 24 hour  Intake 880 ml  Output 500 ml  Net 380 ml   Filed Weights   10/13/19 2151  Weight: 53.5 kg    Exam:  General: NAD    Cardiovascular: S1, S2 present  Respiratory: CTAB  Abdomen: Soft, nontender, nondistended, bowel sounds present  Musculoskeletal: No bilateral pedal edema noted  Skin: Normal  Psychiatry: Normal mood    Data Reviewed: CBC: Recent Labs  Lab 10/13/19 0856 10/13/19 0856 10/13/19 0900 10/14/19 0751 10/15/19 0223 10/16/19 0455 10/17/19 0923  WBC 12.7*  --   --  8.6 8.0 7.2 5.1  NEUTROABS 10.9*  --   --  6.8 5.4 4.9 3.3  HGB 12.9*   < > 13.3 12.4* 12.2* 12.2* 10.6*  HCT 41.4   < > 39.0 38.0* 37.3* 37.9* 32.8*  MCV 92.4  --   --  89.8 89.9 90.2 89.6  PLT 127*  --   --  126* 100* 122* 110*   < > = values in this interval not displayed.   Basic Metabolic Panel: Recent Labs  Lab 10/13/19 0856 10/13/19 0856 10/13/19 0900 10/14/19 0751 10/15/19 0223 10/16/19 0455 10/17/19 0923  NA 142   < > 143 146* 145 149* 142  K 4.0   < > 4.0 3.7 4.0 3.6 3.0*  CL 104   < > 108 108 110 111 105  CO2 21*  --   --  24 21* 25 27  GLUCOSE 136*   < > 131* 94 84 100* 109*  BUN 18   < > 18 19 27* 24* 18  CREATININE 2.22*   < > 2.10* 1.54* 1.52* 1.43* 1.42*  CALCIUM 9.0  --   --  8.8* 8.6* 8.8* 8.1*   < > = values in this interval not displayed.   GFR: Estimated Creatinine Clearance: 28.3 mL/min (A) (by C-G formula based on SCr of 1.42 mg/dL (H)). Liver Function Tests: Recent Labs  Lab 10/13/19 0856  AST 32  ALT 16  ALKPHOS 83  BILITOT 0.8  PROT 8.1  ALBUMIN 4.1   No results for input(s): LIPASE, AMYLASE in the last 168 hours. No results for input(s): AMMONIA in the last 168 hours. Coagulation Profile: Recent Labs  Lab 10/13/19 0856  INR 1.3*   Cardiac Enzymes: No results for input(s): CKTOTAL, CKMB, CKMBINDEX, TROPONINI in the last 168 hours. BNP (last 3 results) No results for input(s): PROBNP in the last 8760 hours. HbA1C: No results for input(s): HGBA1C in the last 72 hours. CBG: Recent Labs  Lab 10/13/19 0855  GLUCAP 121*   Lipid Profile: No results for  input(s): CHOL, HDL, LDLCALC, TRIG, CHOLHDL, LDLDIRECT in the last 72 hours. Thyroid Function Tests: No results for input(s): TSH, T4TOTAL, FREET4, T3FREE, THYROIDAB in the last 72 hours. Anemia Panel: No results for input(s): VITAMINB12, FOLATE, FERRITIN, TIBC, IRON, RETICCTPCT in the last 72 hours. Urine analysis:    Component Value Date/Time   COLORURINE YELLOW 10/13/2019 0425   APPEARANCEUR CLEAR 10/13/2019 0425   LABSPEC 1.032 (H) 10/13/2019 0425   PHURINE 6.0 10/13/2019 0425   GLUCOSEU NEGATIVE 10/13/2019 0425   HGBUR LARGE (A) 10/13/2019 0425   BILIRUBINUR NEGATIVE 10/13/2019 0425   KETONESUR 5 (A) 10/13/2019 0425   PROTEINUR 100 (A) 10/13/2019 0425   NITRITE NEGATIVE 10/13/2019 0425   LEUKOCYTESUR NEGATIVE 10/13/2019 0425   Sepsis Labs: @LABRCNTIP (procalcitonin:4,lacticidven:4)  ) Recent Results (from the past  240 hour(s))  Respiratory Panel by RT PCR (Flu A&B, Covid) - Nasopharyngeal Swab     Status: None   Collection Time: 10/13/19  9:27 AM   Specimen: Nasopharyngeal Swab  Result Value Ref Range Status   SARS Coronavirus 2 by RT PCR NEGATIVE NEGATIVE Final    Comment: (NOTE) SARS-CoV-2 target nucleic acids are NOT DETECTED. The SARS-CoV-2 RNA is generally detectable in upper respiratoy specimens during the acute phase of infection. The lowest concentration of SARS-CoV-2 viral copies this assay can detect is 131 copies/mL. A negative result does not preclude SARS-Cov-2 infection and should not be used as the sole basis for treatment or other patient management decisions. A negative result may occur with  improper specimen collection/handling, submission of specimen other than nasopharyngeal swab, presence of viral mutation(s) within the areas targeted by this assay, and inadequate number of viral copies (<131 copies/mL). A negative result must be combined with clinical observations, patient history, and epidemiological information. The expected result is  Negative. Fact Sheet for Patients:  https://www.moore.com/ Fact Sheet for Healthcare Providers:  https://www.young.biz/ This test is not yet ap proved or cleared by the [place] FDA and  has been authorized for detection and/or diagnosis of SARS-CoV-2 by FDA under an Emergency Use Authorization (EUA). This EUA will remain  in effect (meaning this test can be used) for the duration of the COVID-19 declaration under Section 564(b)(1) of the Act, 21 U.S.C. section 360bbb-3(b)(1), unless the authorization is terminated or revoked sooner.    Influenza A by PCR NEGATIVE NEGATIVE Final   Influenza B by PCR NEGATIVE NEGATIVE Final    Comment: (NOTE) The Xpert Xpress SARS-CoV-2/FLU/RSV assay is intended as an aid in  the diagnosis of influenza from Nasopharyngeal swab specimens and  should not be used as a sole basis for treatment. Nasal washings and  aspirates are unacceptable for Xpert Xpress SARS-CoV-2/FLU/RSV  testing. Fact Sheet for Patients: https://www.moore.com/ Fact Sheet for Healthcare Providers: https://www.young.biz/ This test is not yet approved or cleared by the [place] FDA and  has been authorized for detection and/or diagnosis of SARS-CoV-2 by  FDA under an Emergency Use Authorization (EUA). This EUA will remain  in effect (meaning this test can be used) for the duration of the  Covid-19 declaration under Section 564(b)(1) of the Act, 21  U.S.C. section 360bbb-3(b)(1), unless the authorization is  terminated or revoked. Performed at [facility], [address] [place], [place] [address]   Urine culture     Status: None   Collection Time: 10/14/19  7:51 AM   Specimen: Urine, Random  Result Value Ref Range Status   Specimen Description URINE, RANDOM  Final   Special Requests NONE  Final   Culture   Final    NO GROWTH Performed at [facility], [address]  [place], [place] [address]    Report Status 10/15/2019 FINAL  Final      Studies: No results found.  Scheduled Meds: .  stroke: mapping our early stages of recovery book   Does not apply Once  . aspirin EC  81 mg Oral Daily  . divalproex  375 mg Oral Q12H  . heparin  5,000 Units Subcutaneous Q12H    Continuous Infusions: . lacosamide (VIMPAT) IV 150 mg (10/17/19 1040)     LOS: 4 days     Briant Cedar, MD [facility]  If 7PM-7AM, please contact night-coverage [email or web address] 10/17/2019, 3:30 PM

## 2019-10-18 LAB — CBC WITH DIFFERENTIAL/PLATELET
Abs Immature Granulocytes: 0.02 10*3/uL (ref 0.00–0.07)
Basophils Absolute: 0 10*3/uL (ref 0.0–0.1)
Basophils Relative: 1 %
Eosinophils Absolute: 0.1 10*3/uL (ref 0.0–0.5)
Eosinophils Relative: 2 %
HCT: 29.7 % — ABNORMAL LOW (ref 39.0–52.0)
Hemoglobin: 9.6 g/dL — ABNORMAL LOW (ref 13.0–17.0)
Immature Granulocytes: 0 %
Lymphocytes Relative: 26 %
Lymphs Abs: 1.4 10*3/uL (ref 0.7–4.0)
MCH: 28.7 pg (ref 26.0–34.0)
MCHC: 32.3 g/dL (ref 30.0–36.0)
MCV: 88.9 fL (ref 80.0–100.0)
Monocytes Absolute: 0.8 10*3/uL (ref 0.1–1.0)
Monocytes Relative: 15 %
Neutro Abs: 2.9 10*3/uL (ref 1.7–7.7)
Neutrophils Relative %: 56 %
Platelets: 101 10*3/uL — ABNORMAL LOW (ref 150–400)
RBC: 3.34 MIL/uL — ABNORMAL LOW (ref 4.22–5.81)
RDW: 12.9 % (ref 11.5–15.5)
WBC: 5.2 10*3/uL (ref 4.0–10.5)
nRBC: 0 % (ref 0.0–0.2)

## 2019-10-18 LAB — IRON AND TIBC
Iron: 69 ug/dL (ref 45–182)
Saturation Ratios: 38 % (ref 17.9–39.5)
TIBC: 181 ug/dL — ABNORMAL LOW (ref 250–450)
UIBC: 112 ug/dL

## 2019-10-18 LAB — BASIC METABOLIC PANEL
Anion gap: 10 (ref 5–15)
BUN: 19 mg/dL (ref 8–23)
CO2: 26 mmol/L (ref 22–32)
Calcium: 7.9 mg/dL — ABNORMAL LOW (ref 8.9–10.3)
Chloride: 107 mmol/L (ref 98–111)
Creatinine, Ser: 1.61 mg/dL — ABNORMAL HIGH (ref 0.61–1.24)
GFR calc Af Amer: 44 mL/min — ABNORMAL LOW (ref >60)
GFR calc non Af Amer: 38 mL/min — ABNORMAL LOW (ref >60)
Glucose, Bld: 97 mg/dL (ref 70–99)
Potassium: 3 mmol/L — ABNORMAL LOW (ref 3.5–5.1)
Sodium: 143 mmol/L (ref 135–145)

## 2019-10-18 LAB — TYPE AND SCREEN
ABO/RH(D): O POS
Antibody Screen: NEGATIVE

## 2019-10-18 LAB — FERRITIN: Ferritin: 154 ng/mL (ref 24–336)

## 2019-10-18 LAB — ABO/RH: ABO/RH(D): O POS

## 2019-10-18 LAB — FOLATE: Folate: 8.8 ng/mL (ref >5.9)

## 2019-10-18 LAB — VITAMIN B12: Vitamin B-12: 846 pg/mL (ref 180–914)

## 2019-10-18 LAB — SARS CORONAVIRUS 2 (TAT 6-24 HRS): SARS Coronavirus 2: NEGATIVE

## 2019-10-18 MED ORDER — POTASSIUM CHLORIDE 20 MEQ PO PACK
40.0000 meq | PACK | Freq: Two times a day (BID) | ORAL | Status: AC
  Administered 2019-10-18 (×2): 40 meq via ORAL
  Filled 2019-10-18 (×2): qty 2

## 2019-10-18 NOTE — Progress Notes (Signed)
PROGRESS NOTE  Seth Madden OMV:[id number] DOB: 22-Nov-1930 DOA: 10/13/2019 PCP: [name], MD  HPI/Recap of past 24 hours: HPI from Dr Lin [name] is a 86 y.o. male with medical history significant CVA, HTN, HLD, bilateral carotid stenosis presented with right-sided weakness. Patient daughter who lives with patient reported that patient was last seen normal at 1830 on 10/12/2019. Daughter noticed that he had a left gaze preference and was not moving his right side, and called 911.  Patient has altered mental status nonverbal, most history was provided by patient's daughter and ED record.  According to patient daughter, patient received Cipro last week for UTI by PCP.  ED Course: Blood pressure was 128/80, heart rate 100-110s, CBG 128 and patient was nonvocal.  Neurology consulted.  Patient admitted for further management.    Today, met pt being fed by NT.  Denies any new complaints, looks alert, awake.  Mentation improving.     Assessment/Plan: Active Problems:   Stroke (cerebrum) (HCC)   CVA (cerebral vascular accident) ([place])   CVA ruled out MRI with no acute intracranial abnormality although limited study due to motion artifact, consider normal pressure hydrocephalus in the appropriate clinical scenario due to prominently dilated ventricles CTA head/neck/cerebral perfusion negative for emergent large vessel occlusion LDL 46, A1c 5.7 Echo with EF 60 to 65%, no regional wall motion abnormalities, no intracardiac source of embolus detected Neurology on board Frequent neuro checks, telemetry PT/OT/SLP-SLP recommends dysphagia 3 solids, nectar thick liquid  History of seizure disorder with possible breakthrough seizure/status epilepticus Recently received quinolones for UTI, ??lower seizure threshold, in addition to recent medication changes EEG with evidence of epileptogenic city in the left frontotemporal region, moderate diffuse encephalopathy Overnight  EEG which showed evidence of epileptogenic city in the left frontotemporal region, with evidence of moderate diffuse encephalopathy, nonspecific etiology.  No seizures were seen throughout the recording Neurology on board, recommend increase Vimpat to 150 mg twice daily, Depakote sprinkles 375 twice daily Continue Vimpat, Depakote Seizure precautions  Acute drop in hemoglobin Likely 2/2 hemodilution from IV fluids Anemia panel WNL FOBT pending No obvious signs of bleeding Daily CBC  Hypokalemia Replace as needed  Acute metabolic encephalopathy Likely due to above Management as above  AKI on CKD stage IIIa Improving  S/P IV fluids Daily BMP       Malnutrition Type:      Malnutrition Characteristics:      Nutrition Interventions:       Estimated body mass index is 17.93 kg/m as calculated from the following:   Height as of this encounter: _0  (1.727 m).   Weight as of this encounter: 53.5 kg.     Code Status: DNR  Family Communication: None at bedside  Disposition Plan: Likely SNF, once able to be off of restraints for 24 hours   Consultants:  Neurology  Procedures:  24-hour EEG  Antimicrobials:  None  DVT prophylaxis: Heparin   Objective: Vitals:   10/18/19 0313 10/18/19 0822 10/18/19 1149 10/18/19 1546  BP: 114/64 112/63 138/72 (!) 156/81  Pulse: 66 63 70 80  Resp: _1 Temp: 98.1 F (36.7 C) 98.3 F (36.8 C) 98.7 F (37.1 C) 98.4 F (36.9 C)  TempSrc: Oral Oral Oral Oral  SpO2: 97% 97% 98% 97%  Weight:      Height:        Intake/Output Summary (Last 24 hours) at 10/18/2019 1714 Last data filed at 10/18/2019 1554 Gross per 24 hour  Intake 1274.79 ml  Output 1050 ml  Net 224.79 ml   Filed Weights   10/13/19 2151  Weight: 53.5 kg    Exam:  General: NAD   Cardiovascular: S1, S2 present  Respiratory: CTAB  Abdomen: Soft, nontender, nondistended, bowel sounds present  Musculoskeletal: No bilateral pedal  edema noted  Skin: Normal  Psychiatry: Normal mood    Data Reviewed: CBC: Recent Labs  Lab 10/14/19 0751 10/15/19 0223 10/16/19 0455 10/17/19 0923 10/18/19 0330  WBC 8.6 8.0 7.2 5.1 5.2  NEUTROABS 6.8 5.4 4.9 3.3 2.9  HGB 12.4* 12.2* 12.2* 10.6* 9.6*  HCT 38.0* 37.3* 37.9* 32.8* 29.7*  MCV 89.8 89.9 90.2 89.6 88.9  PLT 126* 100* 122* 110* 974*   Basic Metabolic Panel: Recent Labs  Lab 10/14/19 0751 10/15/19 0223 10/16/19 0455 10/17/19 0923 10/18/19 0330  NA 146* 145 149* 142 143  K 3.7 4.0 3.6 3.0* 3.0*  CL 108 110 111 105 107  CO2 24 21* _0 GLUCOSE 94 84 100* 109* 97  BUN 19 27* 24* 18 19  CREATININE 1.54* 1.52* 1.43* 1.42* 1.61*  CALCIUM 8.8* 8.6* 8.8* 8.1* 7.9*   GFR: Estimated Creatinine Clearance: 24.9 mL/min (A) (by C-G formula based on SCr of 1.61 mg/dL (H)). Liver Function Tests: Recent Labs  Lab 10/13/19 0856  AST 32  ALT 16  ALKPHOS 83  BILITOT 0.8  PROT 8.1  ALBUMIN 4.1   No results for input(s): LIPASE, AMYLASE in the last 168 hours. No results for input(s): AMMONIA in the last 168 hours. Coagulation Profile: Recent Labs  Lab 10/13/19 0856  INR 1.3*   Cardiac Enzymes: No results for input(s): CKTOTAL, CKMB, CKMBINDEX, TROPONINI in the last 168 hours. BNP (last 3 results) No results for input(s): PROBNP in the last 8760 hours. HbA1C: No results for input(s): HGBA1C in the last 72 hours. CBG: Recent Labs  Lab 10/13/19 0855  GLUCAP 121*   Lipid Profile: No results for input(s): CHOL, HDL, LDLCALC, TRIG, CHOLHDL, LDLDIRECT in the last 72 hours. Thyroid Function Tests: No results for input(s): TSH, T4TOTAL, FREET4, T3FREE, THYROIDAB in the last 72 hours. Anemia Panel: Recent Labs    10/18/19 0804  VITAMINB12 846  FOLATE 8.8  FERRITIN 154  TIBC 181*  IRON 69   Urine analysis:    Component Value Date/Time   COLORURINE YELLOW 10/13/2019 0425   APPEARANCEUR CLEAR 10/13/2019 0425   LABSPEC 1.032 (H) 10/13/2019 0425     PHURINE 6.0 10/13/2019 0425   GLUCOSEU NEGATIVE 10/13/2019 0425   HGBUR LARGE (A) 10/13/2019 0425   BILIRUBINUR NEGATIVE 10/13/2019 0425   KETONESUR 5 (A) 10/13/2019 0425   PROTEINUR 100 (A) 10/13/2019 0425   NITRITE NEGATIVE 10/13/2019 0425   LEUKOCYTESUR NEGATIVE 10/13/2019 0425   Sepsis Labs: _1 (procalcitonin:4,lacticidven:4)  ) Recent Results (from the past 240 hour(s))  Respiratory Panel by RT PCR (Flu A&B, Covid) - Nasopharyngeal Swab     Status: None   Collection Time: 10/13/19  9:27 AM   Specimen: Nasopharyngeal Swab  Result Value Ref Range Status   SARS Coronavirus 2 by RT PCR NEGATIVE NEGATIVE Final    Comment: (NOTE) SARS-CoV-2 target nucleic acids are NOT DETECTED. The SARS-CoV-2 RNA is generally detectable in upper respiratoy specimens during the acute phase of infection. The lowest concentration of SARS-CoV-2 viral copies this assay can detect is 131 copies/mL. A negative result does not preclude SARS-Cov-2 infection and should not be used as the sole basis for treatment or other patient management  decisions. A negative result may occur with  improper specimen collection/handling, submission of specimen other than nasopharyngeal swab, presence of viral mutation(s) within the areas targeted by this assay, and inadequate number of viral copies (<131 copies/mL). A negative result must be combined with clinical observations, patient history, and epidemiological information. The expected result is Negative. Fact Sheet for Patients:  PinkCheek.be Fact Sheet for Healthcare Providers:  GravelBags.it This test is not yet ap proved or cleared by the [place] FDA and  has been authorized for detection and/or diagnosis of SARS-CoV-2 by FDA under an Emergency Use Authorization (EUA). This EUA will remain  in effect (meaning this test can be used) for the duration of the COVID-19 declaration under  Section 564(b)(1) of the Act, 21 U.S.C. section 360bbb-3(b)(1), unless the authorization is terminated or revoked sooner.    Influenza A by PCR NEGATIVE NEGATIVE Final   Influenza B by PCR NEGATIVE NEGATIVE Final    Comment: (NOTE) The Xpert Xpress SARS-CoV-2/FLU/RSV assay is intended as an aid in  the diagnosis of influenza from Nasopharyngeal swab specimens and  should not be used as a sole basis for treatment. Nasal washings and  aspirates are unacceptable for Xpert Xpress SARS-CoV-2/FLU/RSV  testing. Fact Sheet for Patients: PinkCheek.be Fact Sheet for Healthcare Providers: GravelBags.it This test is not yet approved or cleared by the [place] FDA and  has been authorized for detection and/or diagnosis of SARS-CoV-2 by  FDA under an Emergency Use Authorization (EUA). This EUA will remain  in effect (meaning this test can be used) for the duration of the  Covid-19 declaration under Section 564(b)(1) of the Act, 21  U.S.C. section 360bbb-3(b)(1), unless the authorization is  terminated or revoked. Performed at [facility], [place] [address] [place], [place] [address]   Urine culture     Status: None   Collection Time: 10/14/19  7:51 AM   Specimen: Urine, Random  Result Value Ref Range Status   Specimen Description URINE, RANDOM  Final   Special Requests NONE  Final   Culture   Final    NO GROWTH Performed at [facility], [place] [address] [place], [place] [address]    Report Status 10/15/2019 FINAL  Final  SARS CORONAVIRUS 2 (TAT 6-24 HRS) Nasopharyngeal Nasopharyngeal Swab     Status: None   Collection Time: 10/18/19  9:56 AM   Specimen: Nasopharyngeal Swab  Result Value Ref Range Status   SARS Coronavirus 2 NEGATIVE NEGATIVE Final    Comment: (NOTE) SARS-CoV-2 target nucleic acids are NOT DETECTED. The SARS-CoV-2 RNA is generally detectable in upper and lower respiratory specimens during the acute  phase of infection. Negative results do not preclude SARS-CoV-2 infection, do not rule out co-infections with other pathogens, and should not be used as the sole basis for treatment or other patient management decisions. Negative results must be combined with clinical observations, patient history, and epidemiological information. The expected result is Negative. Fact Sheet for Patients: SugarRoll.be Fact Sheet for Healthcare Providers: https://www.woods-mathews.com/ This test is not yet approved or cleared by the [place] FDA and  has been authorized for detection and/or diagnosis of SARS-CoV-2 by FDA under an Emergency Use Authorization (EUA). This EUA will remain  in effect (meaning this test can be used) for the duration of the COVID-19 declaration under Section 56 4(b)(1) of the Act, 21 U.S.C. section 360bbb-3(b)(1), unless the authorization is terminated or revoked sooner. Performed at [facility], [place],  [place] [address]       Studies: No results found.  Scheduled Meds: .  stroke: mapping our early stages of recovery book   Does not apply Once  . aspirin EC  81 mg Oral Daily  . divalproex  375 mg Oral Q12H  . potassium chloride  40 mEq Oral BID    Continuous Infusions: . lacosamide (VIMPAT) IV 150 mg (10/18/19 1028)     LOS: 5 days     Alma Friendly, MD [facility]  If 7PM-7AM, please contact night-coverage [email or web address] 10/18/2019, 5:14 PM

## 2019-10-18 NOTE — TOC Progression Note (Signed)
Transition of Care [facility]) - Progression Note    Patient Details  Name: Seth Madden MRN: [id number] Date of Birth: 03-04-1931  Transition of Care [facility]) CM/SW Contact  Nonda Lou, [place] Phone Number: [phone] 10/18/2019, 5:37 PM  Clinical Narrative:    CSW spoke with patient's nurse and verified patient's restraints have been off since 9am this morning. Patient received negative Covid test 2/21. TOC will continue to follow and assist with discharge needs.  Expected Discharge Plan: Skilled Nursing Facility Barriers to Discharge: Continued Medical Work up  Expected Discharge Plan and Services Expected Discharge Plan: Skilled Nursing Facility In-house Referral: Clinical Social Work   Post Acute Care Choice: Home Health Living arrangements for the past 2 months: Single Family Home                                       Social Determinants of Health (SDOH) Interventions    Readmission Risk Interventions No flowsheet data found.

## 2019-10-19 LAB — CBC WITH DIFFERENTIAL/PLATELET
Abs Immature Granulocytes: 0.01 10*3/uL (ref 0.00–0.07)
Basophils Absolute: 0 10*3/uL (ref 0.0–0.1)
Basophils Relative: 1 %
Eosinophils Absolute: 0.1 10*3/uL (ref 0.0–0.5)
Eosinophils Relative: 3 %
HCT: 31.6 % — ABNORMAL LOW (ref 39.0–52.0)
Hemoglobin: 10.3 g/dL — ABNORMAL LOW (ref 13.0–17.0)
Immature Granulocytes: 0 %
Lymphocytes Relative: 25 %
Lymphs Abs: 1.3 10*3/uL (ref 0.7–4.0)
MCH: 29.1 pg (ref 26.0–34.0)
MCHC: 32.6 g/dL (ref 30.0–36.0)
MCV: 89.3 fL (ref 80.0–100.0)
Monocytes Absolute: 0.7 10*3/uL (ref 0.1–1.0)
Monocytes Relative: 13 %
Neutro Abs: 3.1 10*3/uL (ref 1.7–7.7)
Neutrophils Relative %: 58 %
Platelets: 111 10*3/uL — ABNORMAL LOW (ref 150–400)
RBC: 3.54 MIL/uL — ABNORMAL LOW (ref 4.22–5.81)
RDW: 12.9 % (ref 11.5–15.5)
WBC: 5.2 10*3/uL (ref 4.0–10.5)
nRBC: 0 % (ref 0.0–0.2)

## 2019-10-19 LAB — BASIC METABOLIC PANEL
Anion gap: 11 (ref 5–15)
BUN: 16 mg/dL (ref 8–23)
CO2: 24 mmol/L (ref 22–32)
Calcium: 8.1 mg/dL — ABNORMAL LOW (ref 8.9–10.3)
Chloride: 107 mmol/L (ref 98–111)
Creatinine, Ser: 1.36 mg/dL — ABNORMAL HIGH (ref 0.61–1.24)
GFR calc Af Amer: 54 mL/min — ABNORMAL LOW (ref >60)
GFR calc non Af Amer: 47 mL/min — ABNORMAL LOW (ref >60)
Glucose, Bld: 93 mg/dL (ref 70–99)
Potassium: 4.1 mmol/L (ref 3.5–5.1)
Sodium: 142 mmol/L (ref 135–145)

## 2019-10-19 MED ORDER — LACOSAMIDE 150 MG PO TABS: 150.0000 mg | ORAL_TABLET | Freq: Two times a day (BID) | ORAL | 0 refills | Status: AC

## 2019-10-19 MED ORDER — DIVALPROEX SODIUM 125 MG PO CSDR: 375.0000 mg | DELAYED_RELEASE_CAPSULE | Freq: Two times a day (BID) | ORAL | Status: AC

## 2019-10-19 MED ORDER — SENNOSIDES-DOCUSATE SODIUM 8.6-50 MG PO TABS: 1.0000 | ORAL_TABLET | Freq: Every evening | ORAL | Status: AC | PRN

## 2019-10-19 NOTE — Progress Notes (Signed)
  Speech Language Pathology Treatment: Dysphagia  Patient Details Name: Seth Madden MRN: [id number] DOB: 1930/11/14 Today's Date: 10/19/2019 Time: 5732-2025 SLP Time Calculation (min) (ACUTE ONLY): 17 min  Assessment / Plan / Recommendation Clinical Impression  Pt only willing to consume 3 ounces of water to determine readiness for dietary advancement today.  His sequential intake of water was prolonged but no immediate indication of aspiration after intake.  Delayed belching and coughnoted - likley due to esophageal reflux.  SLP recommends to advance diet.  Pt has shown significant improvement in his swallowing and speech/cognition - being oriented to self, place and being able to communicate needs and focus attention.  Clinically pt tolerating po well with 50-75% intake and vitals stable.   Will advance goals including diet to allow thin liquids.  Educated pt to recommendations- significant flat affect noted today with pt stating "let's do it" when asked re: water intake.  Pt declined to consume any po.    HPI HPI: 86 yo male adm to [facility] with AMS - breakthrough seizures vs strokes.  MRI showed probable hydrocephalus.  Pt EEG showed left frontao parietal epiptogenic diffuse encephalopathy.  Pt has PMH of remote CVA approx 10 years ago.  Pt has h/o dysphagia per chart review.  Swallow and speech evaluations ordered.  SLP follow up re: pt's swallow/cog-ling goals today.      SLP Plan  Continue with current plan of care       Recommendations  Diet recommendations: Dysphagia 3 (mechanical soft);Thin liquid Liquids provided via: Cup;Straw Medication Administration: Crushed with puree Supervision: Patient able to self feed Compensations: Small sips/bites;Slow rate(allow time for reflexive dry swallows) Postural Changes and/or Swallow Maneuvers: Out of bed for meals;Seated upright 90 degrees                Oral Care Recommendations: Oral care QID SLP Visit Diagnosis: Dysphagia,  oropharyngeal phase (R13.12);Cognitive communication deficit (K27.062) Plan: Continue with current plan of care       GO                [name] 10/19/2019, 8:20 AM   Rolena Infante, MS [facility] SLP Acute Rehab Services Office [phone]

## 2019-10-19 NOTE — Plan of Care (Signed)
  Problem: Education: Goal: Knowledge of secondary prevention will improve Outcome: Progressing Goal: Knowledge of patient specific risk factors addressed and post discharge goals established will improve Outcome: Progressing   Problem: Education: Goal: Knowledge of General Education information will improve Description: Including pain rating scale, medication(s)/side effects and non-pharmacologic comfort measures Outcome: Progressing   Problem: Health Behavior/Discharge Planning: Goal: Ability to manage health-related needs will improve Outcome: Progressing   Problem: Clinical Measurements: Goal: Ability to maintain clinical measurements within normal limits will improve Outcome: Progressing Goal: Will remain free from infection Outcome: Progressing Goal: Diagnostic test results will improve Outcome: Progressing Goal: Respiratory complications will improve Outcome: Progressing Goal: Cardiovascular complication will be avoided Outcome: Progressing   Problem: Activity: Goal: Risk for activity intolerance will decrease Outcome: Progressing   Problem: Nutrition: Goal: Adequate nutrition will be maintained Outcome: Progressing   Problem: Coping: Goal: Level of anxiety will decrease Outcome: Progressing   Problem: Elimination: Goal: Will not experience complications related to bowel motility Outcome: Progressing Goal: Will not experience complications related to urinary retention Outcome: Progressing   Problem: Pain Managment: Goal: General experience of comfort will improve Outcome: Progressing   Problem: Safety: Goal: Ability to remain free from injury will improve Outcome: Progressing   Problem: Skin Integrity: Goal: Risk for impaired skin integrity will decrease Outcome: Progressing   Problem: Education: Goal: Expressions of having a comfortable level of knowledge regarding the disease process will increase Outcome: Progressing   Problem: Coping: Goal:  Ability to adjust to condition or change in health will improve Outcome: Progressing Goal: Ability to identify appropriate support needs will improve Outcome: Progressing   Problem: Health Behavior/Discharge Planning: Goal: Compliance with prescribed medication regimen will improve Outcome: Progressing   Problem: Medication: Goal: Risk for medication side effects will decrease Outcome: Progressing   Problem: Clinical Measurements: Goal: Complications related to the disease process, condition or treatment will be avoided or minimized Outcome: Progressing Goal: Diagnostic test results will improve Outcome: Progressing   Problem: Safety: Goal: Verbalization of understanding the information provided will improve Outcome: Progressing   Problem: Self-Concept: Goal: Level of anxiety will decrease Outcome: Progressing Goal: Ability to verbalize feelings about condition will improve Outcome: Progressing   Problem: Education: Goal: Knowledge of secondary prevention will improve Outcome: Progressing Goal: Knowledge of patient specific risk factors addressed and post discharge goals established will improve Outcome: Progressing Goal: Individualized Educational Video(s) Outcome: Progressing   Problem: Education: Goal: Ability to incorporate positive changes in behavior to improve self-esteem will improve Outcome: Progressing   Problem: Health Behavior/Discharge Planning: Goal: Ability to remain free from injury will improve Outcome: Progressing

## 2019-10-19 NOTE — TOC Transition Note (Signed)
Transition of Care [facility]) - CM/SW Discharge Note   Patient Details  Name: Filippo Puls MRN: [id number] Date of Birth: December 18, 1930  Transition of Care [facility]) CM/SW Contact:  Baldemar Lenis, LCSW Phone Number: 10/19/2019, 12:32 PM   Clinical Narrative:   Nurse to call report to [phone], Room 102A.    Final next level of care: Skilled Nursing Facility Barriers to Discharge: Barriers Resolved   Patient Goals and CMS Choice Patient states their goals for this hospitalization and ongoing recovery are:: Pt family wants pt to return home with [facility]. CMS Medicare.gov Compare Post Acute Care list provided to:: Patient Choice offered to / list presented to : Adult Children  Discharge Placement              Patient chooses bed at: [facility] Patient to be transferred to facility by: [facility] Name of family member notified: Darl Pikes Patient and family notified of of transfer: 10/19/19  Discharge Plan and Services In-house Referral: Clinical Social Work   Post Acute Care Choice: Home Health                               Social Determinants of Health (SDOH) Interventions     Readmission Risk Interventions No flowsheet data found.

## 2019-10-19 NOTE — Discharge Summary (Signed)
Discharge Summary  Seth Madden ZOX:[id number] DOB: [birth year]  PCP: Mauricia Area, MD  Admit date: 10/13/2019 Discharge date: 10/19/2019  Time spent: 40 mins  Recommendations for Outpatient Follow-up:  1. PCP in 1 week 2. Neurology as scheduled, follows with [facility] neurology, appointment scheduled for Jan 07, 2020  Discharge Diagnoses:  Active Hospital Problems   Diagnosis Date Noted  . 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 CVA (cerebral vascular accident) (HCC) 10/13/2019    Resolved Hospital Problems  No resolved problems to display.    Discharge Condition: Stable  Diet recommendation: Dysphagia 3, thin liquid  Vitals:   10/19/19 0331 10/19/19 0755  BP: 137/69 139/75  Pulse: 64 69  Resp: 16 18  Temp: 98 F (36.7 C) 97.8 F (36.6 C)  SpO2: 96% 93%    History of present illness:  Seth Brookshireis a 86 y.o.malewith medical history significant CVA, HTN, HLD, bilateral carotid stenosispresented with right-sided weakness. Patient daughter who lives with patient reported that patient was last seen normal at 1830 on 10/12/2019. Daughter noticed that he had a left gaze preference and was not moving his right side,and called 911.Patient has altered mental status nonverbal, most history was provided by patient's daughter and ED record.According to patient daughter, patient received Cipro last week for UTI by PCP. ED Course:Blood pressure was 128/80, heart rate 100-110s, CBG 128 and patient was nonvocal.  Neurology consulted. Patient admitted for further management.    Today, patient remained stable, no confusion/aggression noted overnight.  Patient denies any new complaints.  No seizures noted.  Patient stable to transfer to SNF for further rehab.    Hospital Course:  Active Problems:   Stroke (cerebrum) (HCC)   CVA (cerebral vascular accident) (HCC)   CVA ruled out MRI with no acute intracranial abnormality although limited study due to  motion artifact, consider normal pressure hydrocephalus in the appropriate clinical scenario due to prominently dilated ventricles CTA head/neck/cerebral perfusion negative for emergent large vessel occlusion LDL 46, A1c 5.7 Echo with EF 60 to 65%, no regional wall motion abnormalities, no intracardiac source of embolus detected Follow-up with neurology as scheduled  History of seizure disorder with possible breakthrough seizure/status epilepticus Recently received quinolones for UTI, ??lower seizure threshold, in addition to recent medication changes EEG with evidence of epileptogenic city in the left frontotemporal region, moderate diffuse encephalopathy Overnight EEG which showed evidence of epileptogenic city in the left frontotemporal region, with evidence of moderate diffuse encephalopathy, nonspecific etiology.  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, recommend increase Vimpat to 150 mg twice daily, Depakote sprinkles 375 twice daily Continue Vimpat, Depakote Seizure precautions Follow-up with neurology as scheduled  Acute drop in hemoglobin Likely 2/2 hemodilution from IV fluids Anemia panel WNL No obvious signs of bleeding Monitor CBC frequently  Hypokalemia Replace as needed  Acute metabolic encephalopathy Resolved Likely due to above  AKI on CKD stage IIIa Improving  S/P IV fluids Frequent BMP checks           Malnutrition Type:      Malnutrition Characteristics:      Nutrition Interventions:      Estimated body mass index is 17.93 kg/m as calculated from the following:   Height as of this encounter: 5\' 8"  (1.727 m).   Weight as of this encounter: 53.5 kg.    Procedures:  24-hour EEG  Consultations:  Neurology  Discharge Exam: BP 139/75 (BP Location: Right  Arm)   Pulse 69   Temp 97.8 F (36.6 C) (Oral)   Resp 18   Ht  5\' 8"  (1.727 m)   Wt 53.5 kg   SpO2 93%   BMI 17.93 kg/m   General: NAD Cardiovascular: S1, S2 present Respiratory: CTA B Neurology: No obvious focal neurologic deficits noted    Discharge Instructions You were cared for by a hospitalist during your hospital stay. If you have any questions about your discharge medications or the care you received while you were in the hospital after you are discharged, you can call the unit and asked to speak with the hospitalist on call if the hospitalist that took care of you is not available. Once you are discharged, your primary care physician will handle any further medical issues. Please note that NO REFILLS for any discharge medications will be authorized once you are discharged, as it is imperative that you return to your primary care physician (or establish a relationship with a primary care physician if you do not have one) for your aftercare needs so that they can reassess your need for medications and monitor your lab values.  Discharge Instructions    Diet - low sodium heart healthy   Complete by: As directed    Increase activity slowly   Complete by: As directed      Allergies as of 10/19/2019   No Known Allergies     Medication List    STOP taking these medications   divalproex 500 MG 24 hr tablet Commonly known as: DEPAKOTE ER Replaced by: divalproex 125 MG capsule     TAKE these medications   amLODipine 5 MG tablet Commonly known as: NORVASC Take 1 tablet (5 mg total) by mouth daily.   aspirin EC 81 MG tablet Take 81 mg by mouth daily.   atorvastatin 40 MG tablet Commonly known as: LIPITOR Take 40 mg by mouth at bedtime.   divalproex 125 MG capsule Commonly known as: DEPAKOTE SPRINKLE Take 3 capsules (375 mg total) by mouth every 12 (twelve) hours. Replaces: divalproex 500 MG 24 hr tablet   Lacosamide 150 MG Tabs Take 1 tablet (150 mg total) by mouth every 12 (twelve) hours. What changed:   medication  strength  how much to take   senna-docusate 8.6-50 MG tablet Commonly known as: Senokot-S Take 1 tablet by mouth at bedtime as needed for mild constipation.      No Known Allergies  Contact information for follow-up providers    Seltzer, Damian Leavell, MD. Schedule an appointment as soon as possible for a visit in 1 week(s).   Specialty: Oncology Contact information: [address] [place] [address] [phone]            Contact information for after-discharge care    Destination    [facility] .   Service: Skilled Nursing Contact information: [address] [place] [address] [phone]                   The results of significant diagnostics from this hospitalization (including imaging, microbiology, ancillary and laboratory) are listed below for reference.    Significant Diagnostic Studies: EEG  Result Date: 10/13/2019 Charlsie Quest, MD     10/13/2019 12:51 PM Patient Name: Seth Madden MRN: [id number] Epilepsy Attending: Charlsie Quest Referring Physician/Provider: [name], PA Date: 10/13/2019 Duration: 23.12 minutes Patient history: 86 year old male presented as a stroke alert with left-sided gaze preference, nonverbal and right-sided weakness.  EEG to evaluate for status epilepticus. Level of alertness: Lethargic AEDs during EEG study: None Technical aspects: This EEG study was done with scalp electrodes positioned according to the 10-20 International system of electrode placement. Electrical activity was acquired at a sampling rate of 500Hz  and reviewed with a high frequency filter of 70Hz  and a low frequency filter of 1Hz . EEG data were recorded continuously and digitally stored. Description: No clear posterior dominant was seen.  EEG showed continuous generalized 3 to 5 Hz theta-delta slowing.  Sharp waves were seen in left frontotemporal region, maximal F7. Hyperventilation and photic stimulation were not  performed. Abnormality -Continuous slow, generalized -Sharp wave, left frontotemporal region IMPRESSION: This study showed evidence of epileptogenic city in the left frontotemporal region.  Additionally there is evidence of moderate diffuse encephalopathy, nonspecific etiology. No seizures were seen throughout the recording.   CT ANGIO HEAD W OR WO CONTRAST  Result Date: 10/13/2019 CLINICAL DATA:  Right-sided weakness. Leftward gaze. Last seen normal last night. EXAM: CT ANGIOGRAPHY HEAD AND NECK CT PERFUSION BRAIN TECHNIQUE: Multidetector CT imaging of the head and neck was performed using the standard protocol during bolus administration of intravenous contrast. Multiplanar CT image reconstructions and MIPs were obtained to evaluate the vascular anatomy. Carotid stenosis measurements (when applicable) are obtained utilizing NASCET criteria, using the distal internal carotid diameter as the denominator. Multiphase CT imaging of the brain was performed following IV bolus contrast injection. Subsequent parametric perfusion maps were calculated using RAPID software. CONTRAST:  OMNIPAQUE IOHEXOL 350 MG/ML SOLN COMPARISON:  CT head without contrast of the same day. CT head without contrast 08/21/2019. MR head without contrast 03/17/2019. CTA of the head and neck 03/16/2019 at [facility]. FINDINGS: CTA NECK FINDINGS Aortic arch: Atherosclerotic changes are again noted in the aortic arch. Origins the great vessels are incompletely imaged. Right carotid system: The right common carotid artery demonstrates some distal atherosclerotic change. Atherosclerotic changes are present at the bifurcation, similar the prior study. No significant stenosis is present. Cervical right ICA is otherwise within normal limits. Progress Left carotid system: The left common carotid artery demonstrate scattered mural atherosclerotic changes. Mixed atherosclerotic changes are again noted at the bifurcation. Posterior  ulceration is stable. No significant stenosis is present. The cervical left ICA is otherwise normal. Vertebral arteries: The right vertebral artery is the dominant vessel. Moderate to high-grade stenosis is again noted at the non dominant left vertebral artery origin. There is no significant stenosis or occlusion of either vertebral artery in the neck. Skeleton: Degenerative changes of the cervical spine are stable, most evident at C4-5, C5-6, and C6-7. No focal lytic or blastic lesions are present. Other neck: Soft tissues the neck are otherwise unremarkable. Upper chest: Centrilobular emphysematous changes are again noted. No focal nodule, mass, or airspace disease is present. Thoracic inlet is within normal limits. Review of the MIP images confirms the above findings CTA HEAD FINDINGS Anterior circulation: Atherosclerotic changes are again noted within the cavernous internal carotid arteries bilaterally. No significant stenosis is present through the ICA terminus. The A1 and M1 segments are normal. MCA bifurcations are intact. The MCA and ACA branch vessels are within normal limits. Posterior circulation: The right vertebral artery is the dominant vessel. Dominant AICA vessels are present bilaterally. The basilar artery is normal. Both posterior cerebral arteries originate from basilar tip. PCA branch vessels are within normal limits. Venous sinuses: The dural sinuses are patent. The right transverse sinus is dominant. The straight sinus and deep cerebral  veins are intact. Cortical veins are unremarkable. Anatomic variants: None Review of the MIP images confirms the above findings CT Brain Perfusion Findings: ASPECTS: 9/10 CBF (<30%) Volume: 21mL Perfusion (Tmax>6.0s) volume: 85mL Mismatch Volume: 63mL Infarction Location:None by CT perfusion. The area of concern on the noncontrast head CT is above the level imaged. IMPRESSION: 1. No emergent large vessel occlusion. 2. CT perfusion demonstrates no significant core  infarct or penumbra. 3. Stable atherosclerotic changes involving the carotid bifurcations bilaterally, left greater than right. No significant stenosis is present. 4. Stable atherosclerotic changes involving the cavernous internal carotid arteries bilaterally without significant stenosis. 5. High-grade stenosis at the origin of the non dominant left vertebral artery. Electronically Signed   By: Marin Roberts M.D.   On: 10/13/2019 09:29   DG Chest 2 View  Result Date: 10/13/2019 CLINICAL DATA:  Stroke. EXAM: CHEST - 2 VIEW COMPARISON:  08/21/2019 FINDINGS: Lungs appear hyperexpanded. No focal consolidation or pulmonary edema. No pleural effusion. The cardiopericardial silhouette is within normal limits for size. The visualized bony structures of the thorax are intact. IMPRESSION: No active cardiopulmonary disease. Electronically Signed   By: Kennith Center M.D.   On: 10/13/2019 15:49   CT ANGIO NECK W OR WO CONTRAST  Result Date: 10/13/2019 CLINICAL DATA:  Right-sided weakness. Leftward gaze. Last seen normal last night. EXAM: CT ANGIOGRAPHY HEAD AND NECK CT PERFUSION BRAIN TECHNIQUE: Multidetector CT imaging of the head and neck was performed using the standard protocol during bolus administration of intravenous contrast. Multiplanar CT image reconstructions and MIPs were obtained to evaluate the vascular anatomy. Carotid stenosis measurements (when applicable) are obtained utilizing NASCET criteria, using the distal internal carotid diameter as the denominator. Multiphase CT imaging of the brain was performed following IV bolus contrast injection. Subsequent parametric perfusion maps were calculated using RAPID software. CONTRAST:  OMNIPAQUE IOHEXOL 350 MG/ML SOLN COMPARISON:  CT head without contrast of the same day. CT head without contrast 08/21/2019. MR head without contrast 03/17/2019. CTA of the head and neck 03/16/2019 at [facility]. FINDINGS: CTA NECK FINDINGS Aortic  arch: Atherosclerotic changes are again noted in the aortic arch. Origins the great vessels are incompletely imaged. Right carotid system: The right common carotid artery demonstrates some distal atherosclerotic change. Atherosclerotic changes are present at the bifurcation, similar the prior study. No significant stenosis is present. Cervical right ICA is otherwise within normal limits. Progress Left carotid system: The left common carotid artery demonstrate scattered mural atherosclerotic changes. Mixed atherosclerotic changes are again noted at the bifurcation. Posterior ulceration is stable. No significant stenosis is present. The cervical left ICA is otherwise normal. Vertebral arteries: The right vertebral artery is the dominant vessel. Moderate to high-grade stenosis is again noted at the non dominant left vertebral artery origin. There is no significant stenosis or occlusion of either vertebral artery in the neck. Skeleton: Degenerative changes of the cervical spine are stable, most evident at C4-5, C5-6, and C6-7. No focal lytic or blastic lesions are present. Other neck: Soft tissues the neck are otherwise unremarkable. Upper chest: Centrilobular emphysematous changes are again noted. No focal nodule, mass, or airspace disease is present. Thoracic inlet is within normal limits. Review of the MIP images confirms the above findings CTA HEAD FINDINGS Anterior circulation: Atherosclerotic changes are again noted within the cavernous internal carotid arteries bilaterally. No significant stenosis is present through the ICA terminus. The A1 and M1 segments are normal. MCA bifurcations are intact. The MCA and ACA branch vessels are  within normal limits. Posterior circulation: The right vertebral artery is the dominant vessel. Dominant AICA vessels are present bilaterally. The basilar artery is normal. Both posterior cerebral arteries originate from basilar tip. PCA branch vessels are within normal limits. Venous  sinuses: The dural sinuses are patent. The right transverse sinus is dominant. The straight sinus and deep cerebral veins are intact. Cortical veins are unremarkable. Anatomic variants: None Review of the MIP images confirms the above findings CT Brain Perfusion Findings: ASPECTS: 9/10 CBF (<30%) Volume: 0mL Perfusion (Tmax>6.0s) volume: 0mL Mismatch Volume: 0mL Infarction Location:None by CT perfusion. The area of concern on the noncontrast head CT is above the level imaged. IMPRESSION: 1. No emergent large vessel occlusion. 2. CT perfusion demonstrates no significant core infarct or penumbra. 3. Stable atherosclerotic changes involving the carotid bifurcations bilaterally, left greater than right. No significant stenosis is present. 4. Stable atherosclerotic changes involving the cavernous internal carotid arteries bilaterally without significant stenosis. 5. High-grade stenosis at the origin of the non dominant left vertebral artery. Electronically Signed   By: Marin Roberts M.D.   On: 10/13/2019 09:29   MR BRAIN WO CONTRAST  Result Date: 10/13/2019 CLINICAL DATA:  Neuro deficit. Stroke suspected. EXAM: MRI HEAD WITHOUT CONTRAST TECHNIQUE: Multiplanar, multiecho pulse sequences of the brain and surrounding structures were obtained without intravenous contrast. COMPARISON:  Head CT October 13, 2019. FINDINGS: Limited study due to motion. Most acquired images are essentially nondiagnostic. Brain: No acute infarction, hemorrhage, extra-axial collection or large mass lesion. Prominently dilated supratentorial ventricles with associated bowing of the corpus callosum and crowding of the parietal sulci may represent normal pressure hydrocephalus in the appropriate clinical scenario. Vascular: Evaluation precluded by motion. Skull and upper cervical spine: Grossly unremarkable. Sinuses/Orbits: Negative. Other: None. IMPRESSION: 1. Significantly limited study due to motion artifact. 2. No acute intracranial  abnormality identified. 3. Prominently dilated supratentorial ventricles with associated bowing of corpus callosum and crowding of parietal sulci may represent normal pressure hydrocephalus in the appropriate clinical scenario. Electronically Signed   By: Baldemar Lenis M.D.   On: 10/13/2019 14:51   CT CEREBRAL PERFUSION W CONTRAST  Result Date: 10/13/2019 CLINICAL DATA:  Right-sided weakness. Leftward gaze. Last seen normal last night. EXAM: CT ANGIOGRAPHY HEAD AND NECK CT PERFUSION BRAIN TECHNIQUE: Multidetector CT imaging of the head and neck was performed using the standard protocol during bolus administration of intravenous contrast. Multiplanar CT image reconstructions and MIPs were obtained to evaluate the vascular anatomy. Carotid stenosis measurements (when applicable) are obtained utilizing NASCET criteria, using the distal internal carotid diameter as the denominator. Multiphase CT imaging of the brain was performed following IV bolus contrast injection. Subsequent parametric perfusion maps were calculated using RAPID software. CONTRAST:  OMNIPAQUE IOHEXOL 350 MG/ML SOLN COMPARISON:  CT head without contrast of the same day. CT head without contrast 08/21/2019. MR head without contrast 03/17/2019. CTA of the head and neck 03/16/2019 at [facility]. FINDINGS: CTA NECK FINDINGS Aortic arch: Atherosclerotic changes are again noted in the aortic arch. Origins the great vessels are incompletely imaged. Right carotid system: The right common carotid artery demonstrates some distal atherosclerotic change. Atherosclerotic changes are present at the bifurcation, similar the prior study. No significant stenosis is present. Cervical right ICA is otherwise within normal limits. Progress Left carotid system: The left common carotid artery demonstrate scattered mural atherosclerotic changes. Mixed atherosclerotic changes are again noted at the bifurcation. Posterior ulceration  is stable. No significant stenosis is present. The cervical  left ICA is otherwise normal. Vertebral arteries: The right vertebral artery is the dominant vessel. Moderate to high-grade stenosis is again noted at the non dominant left vertebral artery origin. There is no significant stenosis or occlusion of either vertebral artery in the neck. Skeleton: Degenerative changes of the cervical spine are stable, most evident at C4-5, C5-6, and C6-7. No focal lytic or blastic lesions are present. Other neck: Soft tissues the neck are otherwise unremarkable. Upper chest: Centrilobular emphysematous changes are again noted. No focal nodule, mass, or airspace disease is present. Thoracic inlet is within normal limits. Review of the MIP images confirms the above findings CTA HEAD FINDINGS Anterior circulation: Atherosclerotic changes are again noted within the cavernous internal carotid arteries bilaterally. No significant stenosis is present through the ICA terminus. The A1 and M1 segments are normal. MCA bifurcations are intact. The MCA and ACA branch vessels are within normal limits. Posterior circulation: The right vertebral artery is the dominant vessel. Dominant AICA vessels are present bilaterally. The basilar artery is normal. Both posterior cerebral arteries originate from basilar tip. PCA branch vessels are within normal limits. Venous sinuses: The dural sinuses are patent. The right transverse sinus is dominant. The straight sinus and deep cerebral veins are intact. Cortical veins are unremarkable. Anatomic variants: None Review of the MIP images confirms the above findings CT Brain Perfusion Findings: ASPECTS: 9/10 CBF (<30%) Volume: 0mL Perfusion (Tmax>6.0s) volume: 0mL Mismatch Volume: 0mL Infarction Location:None by CT perfusion. The area of concern on the noncontrast head CT is above the level imaged. IMPRESSION: 1. No emergent large vessel occlusion. 2. CT perfusion demonstrates no significant core infarct or  penumbra. 3. Stable atherosclerotic changes involving the carotid bifurcations bilaterally, left greater than right. No significant stenosis is present. 4. Stable atherosclerotic changes involving the cavernous internal carotid arteries bilaterally without significant stenosis. 5. High-grade stenosis at the origin of the non dominant left vertebral artery. Electronically Signed   By: Marin Roberts M.D.   On: 10/13/2019 09:29   DG Swallowing Func-Speech Pathology  Result Date: 10/16/2019 Objective Swallowing Evaluation: Type of Study: MBS-Modified Barium Swallow Study  Patient Details Name: Seth Madden MRN: [id number] Date of Birth: 1930/12/25 Today's Date: 10/16/2019 Time: SLP Start Time (ACUTE ONLY): 0815 -SLP Stop Time (ACUTE ONLY): 0842 SLP Time Calculation (min) (ACUTE ONLY): 27 min Past Medical History: Past Medical History: Diagnosis Date . Bilateral carotid artery stenosis  . Dysphagia  . Hyperlipidemia  . Hypertension  . Seizure (HCC)  . Stroke (HCC)  . UTI (urinary tract infection)  Past Surgical History: No past surgical history on file. HPI: 86 yo male adm to [facility] with AMS - breakthrough seizures vs strokes.  MRI showed probable hydrocephalus.  Pt EEG showed left frontao parietal epiptogenic diffuse encephalopathy.  Pt has PMH of remote CVA approx 10 years ago.  Pt has h/o dysphagia per chart review.  Swallow and speech evaluations ordered.  Subjective: pt awake in bed, left gaze preference ongoing Assessment / Plan / Recommendation CHL IP CLINICAL IMPRESSIONS 10/16/2019 Clinical Impression Pt presents with mild oropharyngeal dysphagia c/b decreased oral control with resultant premature spillage of boluses and oral retention.  Pt did not orally transit barium tablet with pudding x2 attempts. Pharyngeal swallow c/b delay in swallow with liquids pooling extensively in pyriform sinus prior to swallow.  NO aspiration or penetration noted but pt is impulsuive and his cough is weak thus recommend  conservative diet to mitigate his risk.  Would allow thin water between meals after  oral care for his comfort and hygeine.  Anticipate pt will be able to advance diet to allow thin liquids as medically improves  - dys3 will likely be advised for him long term given his dysarthria and ill fitting dentures.  Of note, pt did clear his throat during MBS with no barium visualized in larynx/trachea.  Using teach back with live monitor, education completed during session.  SLP Visit Diagnosis Dysphagia, oropharyngeal phase (R13.12) Attention and concentration deficit following -- Frontal lobe and executive function deficit following -- Impact on safety and function Moderate aspiration risk   CH[facility] IP TREATMENT RECOMMENDATION 10/16/2019 Treatment Recommendations Therapy as outlined in treatment plan below   Prognosis 10/16/2019 Prognosis for Safe Diet Advancement Good Barriers to Reach Goals Cognitive deficits Barriers/Prognosis Comment -- [facility] IP DIET RECOMMENDATION 10/16/2019 SLP Diet Recommendations Dysphagia 3 (Mech soft) solids;Nectar thick liquid;Free water protocol after oral care Liquid Administration via Cup;Straw Medication Administration Crushed with puree Compensations Small sips/bites;Slow rate Postural Changes --   [facility] IP OTHER RECOMMENDATIONS 10/16/2019 Recommended Consults -- Oral Care Recommendations Oral care QID Other Recommendations Order thickener from pharmacy;Clarify dietary restrictions   [facility] IP FOLLOW UP RECOMMENDATIONS 10/16/2019 Follow up Recommendations Skilled Nursing facility   [facility] IP FREQUENCY AND DURATION 10/16/2019 Speech Therapy Frequency (ACUTE ONLY) min 1 x/week Treatment Duration 1 week      [facility] IP ORAL PHASE 10/16/2019 Oral Phase Impaired Oral - Pudding Teaspoon -- Oral - Pudding Cup -- Oral - Honey Teaspoon -- Oral - Honey Cup -- Oral - Nectar Teaspoon -- Oral - Nectar Cup Weak lingual manipulation;Premature spillage;Decreased bolus cohesion Oral - Nectar Straw -- Oral - Thin Teaspoon Weak  lingual manipulation;Decreased bolus cohesion;Premature spillage Oral - Thin Cup Weak lingual manipulation;Premature spillage;Decreased bolus cohesion Oral - Thin Straw Weak lingual manipulation;Decreased bolus cohesion;Premature spillage Oral - Puree Weak lingual manipulation;Reduced posterior propulsion;Decreased bolus cohesion Oral - Mech Soft Weak lingual manipulation;Reduced posterior propulsion;Decreased bolus cohesion Oral - Regular -- Oral - Multi-Consistency -- Oral - Pill Weak lingual manipulation;Lingual/palatal residue Oral Phase - Comment pt did not transit tablet despite multiple attempts with pudding, pt expectorated barium tablet with SLP verbal cue  [facility] IP PHARYNGEAL PHASE 10/16/2019 Pharyngeal Phase Impaired Pharyngeal- Pudding Teaspoon -- Pharyngeal -- Pharyngeal- Pudding Cup -- Pharyngeal -- Pharyngeal- Honey Teaspoon -- Pharyngeal -- Pharyngeal- Honey Cup -- Pharyngeal -- Pharyngeal- Nectar Teaspoon -- Pharyngeal -- Pharyngeal- Nectar Cup -- Pharyngeal Material does not enter airway Pharyngeal- Nectar Straw -- Pharyngeal -- Pharyngeal- Thin Teaspoon Delayed swallow initiation-pyriform sinuses Pharyngeal Material does not enter airway Pharyngeal- Thin Cup Delayed swallow initiation-pyriform sinuses Pharyngeal Material does not enter airway Pharyngeal- Thin Straw Delayed swallow initiation-pyriform sinuses Pharyngeal Material does not enter airway Pharyngeal- Puree WFL;Pharyngeal residue - valleculae Pharyngeal Material does not enter airway Pharyngeal- Mechanical Soft WFL;Pharyngeal residue - valleculae Pharyngeal Material does not enter airway Pharyngeal- Regular -- Pharyngeal -- Pharyngeal- Multi-consistency -- Pharyngeal -- Pharyngeal- Pill NT Pharyngeal -- Pharyngeal Comment delay in reflexive swallowing noted to pyriform sinus with thin essentially filling entire area before swallow, pt has large lateral channels which allows pooling but given pt's impulsivity and taking very large boluses,  recommend nectar thick liquids with meals and thin water between meals  CH[facility] IP CERVICAL ESOPHAGEAL PHASE 10/16/2019 Cervical Esophageal Phase WFL Pudding Teaspoon -- Pudding Cup -- Honey Teaspoon -- Honey Cup -- Nectar Teaspoon -- Nectar Cup -- Nectar Straw -- Thin Teaspoon -- Thin Cup -- Thin Straw -- Puree -- Mechanical Soft -- Regular -- Multi-consistency --  Pill -- Cervical Esophageal Comment -- [name] 10/16/2019, 9:06 AM  Rolena Infante, MS [facility] SLP Acute Rehab Services Office [phone]             Overnight EEG with video  Result Date: 10/14/2019 Charlsie Quest, MD     10/15/2019  2:47 PM Patient Name: Seth Madden MRN: [id number] Epilepsy Attending: Charlsie Quest Referring Physician/Provider: [name], PA Duration: 10/13/2019 1543 to 1750 10/14/2019 0953 to 10/15/2019 0753  Patient history: 86 year old male presented as a stroke alert with left-sided gaze preference, nonverbal and right-sided weakness.  EEG to evaluate for status epilepticus.  Level of alertness: Lethargic, asleep  AEDs during EEG study: VPA, Vimpat  Technical aspects: This EEG study was done with scalp electrodes positioned according to the 10-20 International system of electrode placement. Electrical activity was acquired at a sampling rate of 500Hz  and reviewed with a high frequency filter of 70Hz  and a low frequency filter of 1Hz . EEG data were recorded continuously and digitally stored.  Description: During awake state, no clear posterior dominant rhythm was seen.  Sleep was characterized by vertex waves, sleep spindles (12 to 15 Hz), maximal frontocentral.  EEG showed continuous generalized 3 to 5 Hz theta-delta slowing.  Frequent sharp waves were seen in left frontotemporal region, maximal F7, at times rhythmic without any clear evolution. Hyperventilation and photic stimulation were not performed.  Abnormality -Continuous slow, generalized -Sharp wave, left frontotemporal region  IMPRESSION: This study  showed evidence of epileptogenicity in the left frontotemporal region.  Additionally there is evidence of moderate diffuse encephalopathy, nonspecific etiology. No seizures were seen throughout the recording.   ECHOCARDIOGRAM COMPLETE  Result Date: 10/14/2019    ECHOCARDIOGRAM REPORT   Patient Name:   Seth Madden Date of Exam: 10/14/2019 Medical Rec #:  Charlsie Quest          Height:       68.0 in Accession #:    10/16/2019         Weight:       117.9 lb Date of Birth:  [birth year]         BSA:          1.63 m Patient Age:    86 years           BP:           120/70 mmHg Patient Gender: M                  HR:           84 bpm. Exam Location:  Inpatient Procedure: 2D Echo, Color Doppler and Cardiac Doppler Indications:    Stroke i163.9  History:        Patient has no prior history of Echocardiogram examinations.                 Risk Factors:Hypertension and Dyslipidemia.  Sonographer:    [id number] Senior RDCS Referring Phys: [id number] 13/06/1931  Sonographer Comments: Very technically difficult due to patient body habitus, position, and altered mental status. IMPRESSIONS  1. Technically challengind study, limited windows/images in all views. Normal LVEF without apparent wall motion abnormalities. No hemodynamically significant valve disease. No clear shunting.  2. Left ventricular ejection fraction, by estimation, is 60 to 65%. The left ventricle has normal function. The left ventricle has no regional wall motion abnormalities. Left ventricular diastolic parameters are indeterminate.  3. Right ventricular systolic function is normal. The right ventricular size is normal.  There is mildly elevated pulmonary artery systolic pressure.  4. The mitral valve is grossly normal. Trivial mitral valve regurgitation. No evidence of mitral stenosis.  5. The aortic valve was not well visualized. Aortic valve regurgitation is not visualized. No aortic stenosis is present. Conclusion(s)/Recomendation(s): No  intracardiac source of embolism detected on this transthoracic study. A transesophageal echocardiogram is recommended to exclude cardiac source of embolism if clinically indicated. FINDINGS  Left Ventricle: Left ventricular ejection fraction, by estimation, is 60 to 65%. The left ventricle has normal function. The left ventricle has no regional wall motion abnormalities. The left ventricular internal cavity size was normal in size. There is  no left ventricular hypertrophy. Left ventricular diastolic parameters are indeterminate. Right Ventricle: The right ventricular size is normal. No increase in right ventricular wall thickness. Right ventricular systolic function is normal. There is mildly elevated pulmonary artery systolic pressure. The tricuspid regurgitant velocity is 2.35  m/s, and with an assumed right atrial pressure of 8 mmHg, the estimated right ventricular systolic pressure is 62.8 mmHg. Left Atrium: Left atrial size was normal in size. Right Atrium: Right atrial size was normal in size. Pericardium: Trivial pericardial effusion is present. Mitral Valve: The mitral valve is grossly normal. Mild to moderate mitral annular calcification. Trivial mitral valve regurgitation. No evidence of mitral valve stenosis. Tricuspid Valve: The tricuspid valve is normal in structure. Tricuspid valve regurgitation is trivial. No evidence of tricuspid stenosis. Aortic Valve: The aortic valve was not well visualized. Aortic valve regurgitation is not visualized. No aortic stenosis is present. Pulmonic Valve: The pulmonic valve was not well visualized. Pulmonic valve regurgitation is not visualized. No evidence of pulmonic stenosis. Aorta: The aortic root and ascending aorta are structurally normal, with no evidence of dilitation. Pulmonary Artery: The pulmonary artery is not well seen. Venous: The inferior vena cava was not well visualized. IAS/Shunts: The atrial septum is grossly normal.  LEFT VENTRICLE PLAX 2D LVIDd:          2.90 cm  Diastology LVIDs:         2.00 cm  LV e' lateral:   5.98 cm/s LV PW:         1.10 cm  LV E/e' lateral: 7.7 LV IVS:        1.10 cm  LV e' medial:    3.81 cm/s LVOT diam:     2.00 cm  LV E/e' medial:  12.1 LV SV:         53.41 ml LV SV Index:   12.26 LVOT Area:     3.14 cm  RIGHT VENTRICLE RV S prime:     16.00 cm/s LEFT ATRIUM           Index       RIGHT ATRIUM           Index LA diam:      2.50 cm 1.53 cm/m  RA Area:     10.40 cm LA Vol (A4C): 30.7 ml 18.80 ml/m RA Volume:   23.00 ml  14.09 ml/m  AORTIC VALVE LVOT Vmax:   110.00 cm/s LVOT Vmean:  80.800 cm/s LVOT VTI:    0.170 m  AORTA Ao Asc diam: 2.40 cm MITRAL VALVE               TRICUSPID VALVE MV Area (PHT): 3.53 cm    TR Peak grad:   22.1 mmHg MV Decel Time: 215 msec    TR Vmax:        235.00  cm/s MV E velocity: 46.00 cm/s MV A velocity: 69.70 cm/s  SHUNTS MV E/A ratio:  0.66        Systemic VTI:  0.17 m                            Systemic Diam: 2.00 cm Jodelle Red MD Electronically signed by Jodelle Red MD Signature Date/Time: 10/14/2019/1:16:06 PM    Final    CT HEAD CODE STROKE WO CONTRAST  Result Date: 10/13/2019 CLINICAL DATA:  Code stroke. Focal neuro deficit for greater than 6 hours. Right arm and leg weakness. Left-sided gaze deficit. EXAM: CT HEAD WITHOUT CONTRAST TECHNIQUE: Contiguous axial images were obtained from the base of the skull through the vertex without intravenous contrast. COMPARISON:  CT head without contrast 08/21/2019 at [facility]. MRI of the head without contrast 03/17/2019 at [facility]. FINDINGS: Brain: Advanced atrophy and white matter disease is present. There is focal loss of gray-white differentiation in the precentral gyrus near the vertex. No acute hemorrhage or mass lesion is present. The ventricles are proportionate to the degree of atrophy. A remote lacunar infarct is present in the left lentiform nucleus. Basal ganglia are otherwise intact. Insular ribbon is  normal bilaterally. Calcifications in the left sylvian fissure are stable. Vascular: Mild atherosclerotic changes are present without a hyperdense vessel. Skull: Insert normal skull No significant extracranial soft tissue lesion is present. Sinuses/Orbits: The paranasal sinuses and mastoid air cells are clear. Bilateral lens replacements are noted. Globes and orbits are otherwise unremarkable. ASPECTS [facility] Stroke Program Early CT Score) - Ganglionic level infarction (caudate, lentiform nuclei, internal capsule, insula, M1-M3 cortex): 6/7 - Supraganglionic infarction (M4-M6 cortex): 3/3 Total score (0-10 with 10 being normal): 9/10 IMPRESSION: 1. Focal loss of gray-white differentiation in the left precentral gyrus near the vertex. 2. No other focal cortical or basal ganglia acute abnormality. 3. Advanced atrophy and diffuse white matter disease likely reflects the sequela of chronic microvascular ischemia. 4. ASPECTS is 10/10 The above was relayed via text pager to Dr. Amada Jupiter on 10/13/2019 at 09:13 . Electronically Signed   By: Marin Roberts M.D.   On: 10/13/2019 09:13    Microbiology: Recent Results (from the past 240 hour(s))  Respiratory Panel by RT PCR (Flu A&B, Covid) - Nasopharyngeal Swab     Status: None   Collection Time: 10/13/19  9:27 AM   Specimen: Nasopharyngeal Swab  Result Value Ref Range Status   SARS Coronavirus 2 by RT PCR NEGATIVE NEGATIVE Final    Comment: (NOTE) SARS-CoV-2 target nucleic acids are NOT DETECTED. The SARS-CoV-2 RNA is generally detectable in upper respiratoy specimens during the acute phase of infection. The lowest concentration of SARS-CoV-2 viral copies this assay can detect is 131 copies/mL. A negative result does not preclude SARS-Cov-2 infection and should not be used as the sole basis for treatment or other patient management decisions. A negative result may occur with  improper specimen collection/handling, submission of specimen other than  nasopharyngeal swab, presence of viral mutation(s) within the areas targeted by this assay, and inadequate number of viral copies (<131 copies/mL). A negative result must be combined with clinical observations, patient history, and epidemiological information. The expected result is Negative. Fact Sheet for Patients:  https://www.moore.com/ Fact Sheet for Healthcare Providers:  https://www.young.biz/ This test is not yet ap proved or cleared by the [place] FDA and  has been authorized for detection and/or diagnosis of SARS-CoV-2 by FDA  under an Emergency Use Authorization (EUA). This EUA will remain  in effect (meaning this test can be used) for the duration of the COVID-19 declaration under Section 564(b)(1) of the Act, 21 U.S.C. section 360bbb-3(b)(1), unless the authorization is terminated or revoked sooner.    Influenza A by PCR NEGATIVE NEGATIVE Final   Influenza B by PCR NEGATIVE NEGATIVE Final    Comment: (NOTE) The Xpert Xpress SARS-CoV-2/FLU/RSV assay is intended as an aid in  the diagnosis of influenza from Nasopharyngeal swab specimens and  should not be used as a sole basis for treatment. Nasal washings and  aspirates are unacceptable for Xpert Xpress SARS-CoV-2/FLU/RSV  testing. Fact Sheet for Patients: https://www.moore.com/https://www.fda.gov/media/142436/download Fact Sheet for Healthcare Providers: https://www.young.biz/https://www.fda.gov/media/142435/download This test is not yet approved or cleared by the [place] FDA and  has been authorized for detection and/or diagnosis of SARS-CoV-2 by  FDA under an Emergency Use Authorization (EUA). This EUA will remain  in effect (meaning this test can be used) for the duration of the  Covid-19 declaration under Section 564(b)(1) of the Act, 21  U.S.C. section 360bbb-3(b)(1), unless the authorization is  terminated or revoked. Performed at [facility], [address] [place], [place] [address]   Urine  culture     Status: None   Collection Time: 10/14/19  7:51 AM   Specimen: Urine, Random  Result Value Ref Range Status   Specimen Description URINE, RANDOM  Final   Special Requests NONE  Final   Culture   Final    NO GROWTH Performed at [facility], [address] [place], [place] [address]    Report Status 10/15/2019 FINAL  Final  SARS CORONAVIRUS 2 (TAT 6-24 HRS) Nasopharyngeal Nasopharyngeal Swab     Status: None   Collection Time: 10/18/19  9:56 AM   Specimen: Nasopharyngeal Swab  Result Value Ref Range Status   SARS Coronavirus 2 NEGATIVE NEGATIVE Final    Comment: (NOTE) SARS-CoV-2 target nucleic acids are NOT DETECTED. The SARS-CoV-2 RNA is generally detectable in upper and lower respiratory specimens during the acute phase of infection. Negative results do not preclude SARS-CoV-2 infection, do not rule out co-infections with other pathogens, and should not be used as the sole basis for treatment or other patient management decisions. Negative results must be combined with clinical observations, patient history, and epidemiological information. The expected result is Negative. Fact Sheet for Patients: HairSlick.nohttps://www.fda.gov/media/138098/download Fact Sheet for Healthcare Providers: quierodirigir.comhttps://www.fda.gov/media/138095/download This test is not yet approved or cleared by the [place] FDA and  has been authorized for detection and/or diagnosis of SARS-CoV-2 by FDA under an Emergency Use Authorization (EUA). This EUA will remain  in effect (meaning this test can be used) for the duration of the COVID-19 declaration under Section 56 4(b)(1) of the Act, 21 U.S.C. section 360bbb-3(b)(1), unless the authorization is terminated or revoked sooner. Performed at [facility], [address] [place], [place] [address]      Labs: Basic Metabolic Panel: Recent Labs  Lab 10/15/19 0223 10/16/19 0455 10/17/19 0923 10/18/19 0330 10/19/19 0357  NA 145 149* 142 143  142  K 4.0 3.6 3.0* 3.0* 4.1  CL 110 111 105 107 107  CO2 21* 25 27 26 24   GLUCOSE 84 100* 109* 97 93  BUN 27* 24* 18 19 16   CREATININE 1.52* 1.43* 1.42* 1.61* 1.36*  CALCIUM 8.6* 8.8* 8.1* 7.9* 8.1*   Liver Function Tests: Recent Labs  Lab 10/13/19 0856  AST 32  ALT 16  ALKPHOS 83  BILITOT 0.8  PROT 8.1  ALBUMIN 4.1   No results for input(s): LIPASE, AMYLASE in the last 168 hours. No results for input(s): AMMONIA in the last 168 hours. CBC: Recent Labs  Lab 10/15/19 0223 10/16/19 0455 10/17/19 0923 10/18/19 0330 10/19/19 0357  WBC 8.0 7.2 5.1 5.2 5.2  NEUTROABS 5.4 4.9 3.3 2.9 3.1  HGB 12.2* 12.2* 10.6* 9.6* 10.3*  HCT 37.3* 37.9* 32.8* 29.7* 31.6*  MCV 89.9 90.2 89.6 88.9 89.3  PLT 100* 122* 110* 101* 111*   Cardiac Enzymes: No results for input(s): CKTOTAL, CKMB, CKMBINDEX, TROPONINI in the last 168 hours. BNP: BNP (last 3 results) No results for input(s): BNP in the last 8760 hours.  ProBNP (last 3 results) No results for input(s): PROBNP in the last 8760 hours.  CBG: Recent Labs  Lab 10/13/19 0855  GLUCAP 121*       Signed:  Briant CedarNkeiruka J Lariza Cothron, MD [facility] 10/19/2019, 11:42 AM

## 2019-10-19 NOTE — Progress Notes (Signed)
Called report to [employer] at [facility] at [phone].

## 2021-06-27 DEATH — deceased

## 2021-10-20 IMAGING — MR MR HEAD W/O CM
9 series · 48 of 48 positions shown · non-contrast
Comparison: Head CT October 13, 2019.

CLINICAL DATA: Neuro deficit. Stroke suspected.

EXAM:
MRI HEAD WITHOUT CONTRAST
TECHNIQUE: Multiplanar, multiecho pulse sequences of the brain and surrounding
structures were obtained without intravenous contrast.

[Series 5: DWI · axial · 3.0mm · 0.88mm/px · z∈[-73,+66]mm · 12 of 96 slices shown (1 of 4)]
[im 1/96]
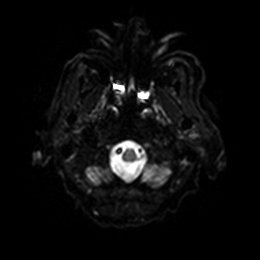
[im 9/96]
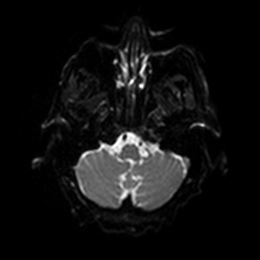
[im 18/96]
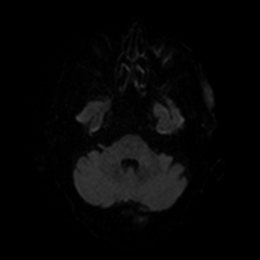
[im 26/96]
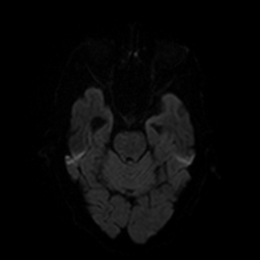
[im 35/96]
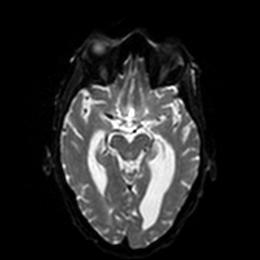
[im 44/96]
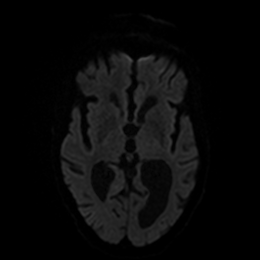
[im 52/96]
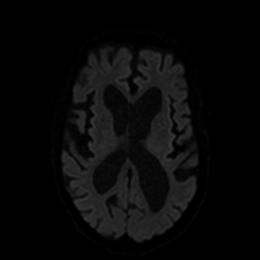
[im 61/96]
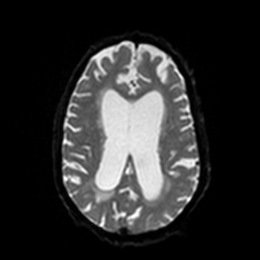
[im 70/96]
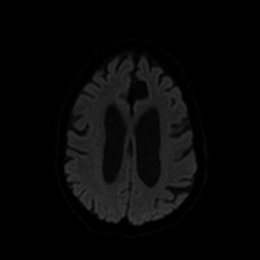
[im 78/96]
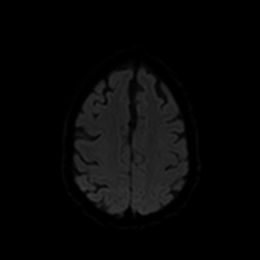
[im 87/96]
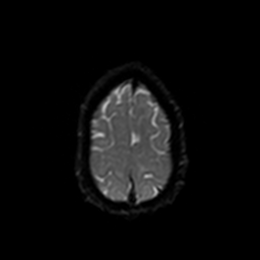
[im 96/96]
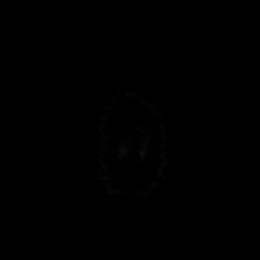

[Series 6: DWI · axial · 3.0mm · 0.88mm/px · z∈[-73,+66]mm · 6 of 48 slices shown (2 of 4)]
[im 1/48]
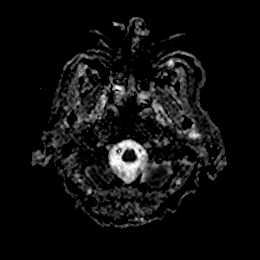
[im 10/48]
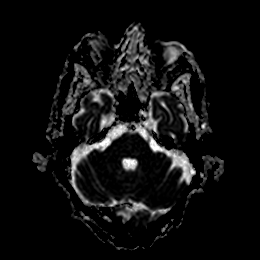
[im 19/48]
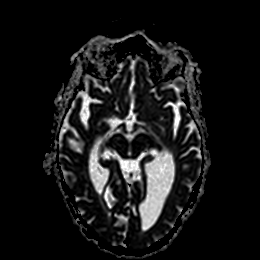
[im 29/48]
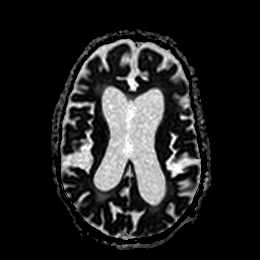
[im 38/48]
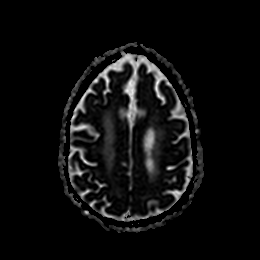
[im 48/48]
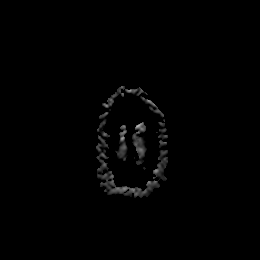

[Series 7: DWI · coronal · 4.0mm · 0.88mm/px · 8 of 68 slices shown (3 of 4)]
[im 1/68]
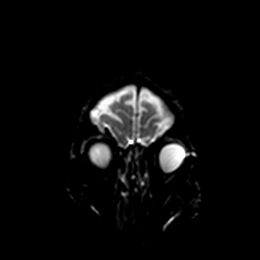
[im 10/68]
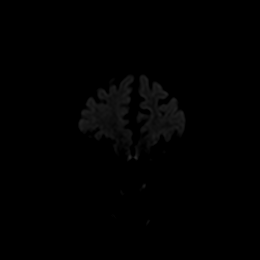
[im 20/68]
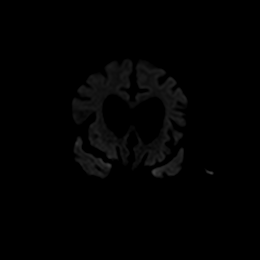
[im 29/68]
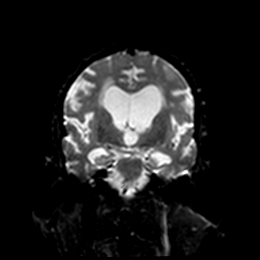
[im 39/68]
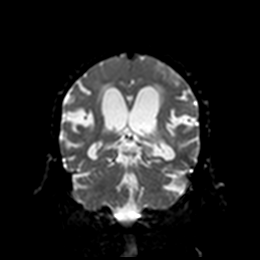
[im 48/68]
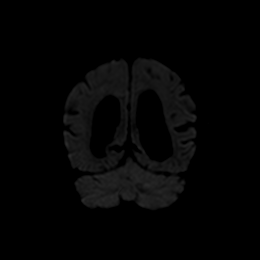
[im 58/68]
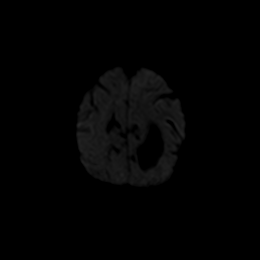
[im 68/68]
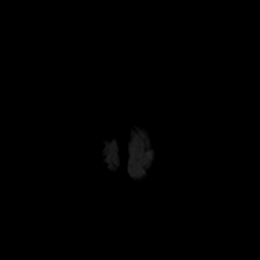

[Series 8: DWI · coronal · 4.0mm · 0.88mm/px · 4 of 34 slices shown (4 of 4)]
[im 1/34]
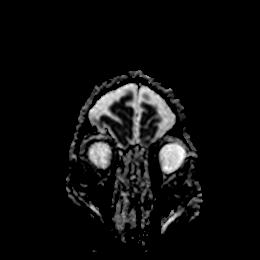
[im 12/34]
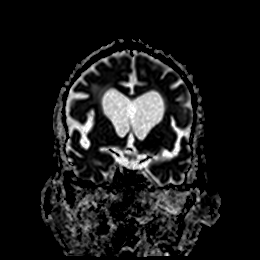
[im 23/34]
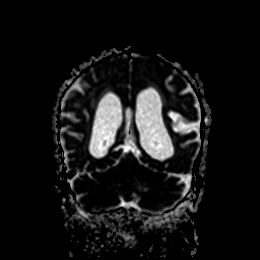
[im 34/34]
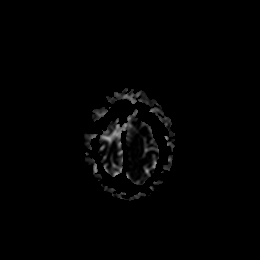

[Series 9: T1 · sagittal · 5.0mm · 0.75mm/px · 3 of 23 slices shown]
[im 1/23]
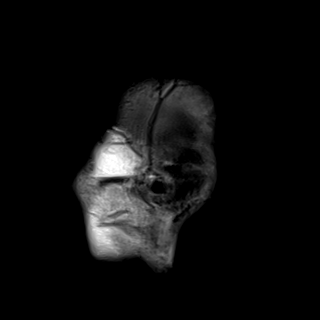
[im 12/23]
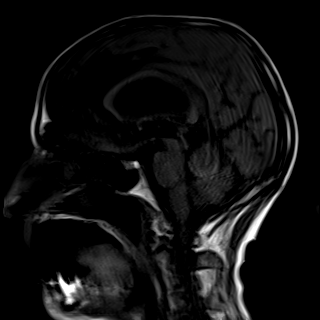
[im 23/23]
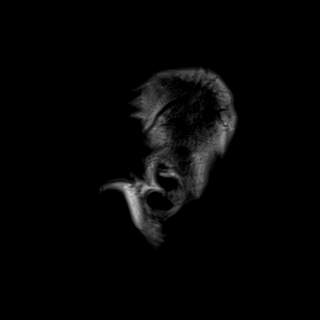

[Series 10: T2 · axial · 5.0mm · 0.72mm/px · z∈[-74,+69]mm · 3 of 25 slices shown]
[im 1/25]
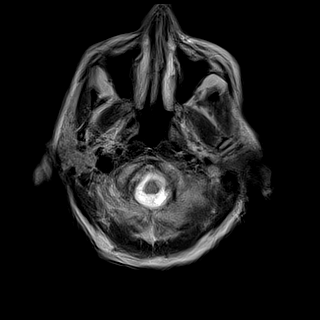
[im 13/25]
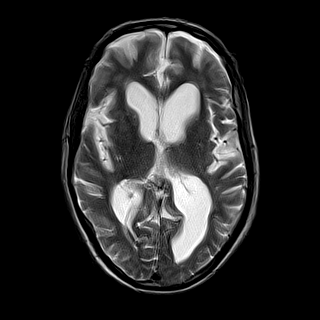
[im 25/25]
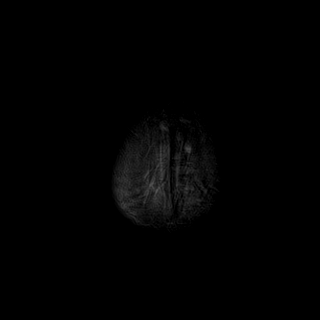

[Series 11: FLAIR · axial · 5.0mm · 0.90mm/px · z∈[-74,+69]mm · 3 of 25 slices shown]
[im 1/25]
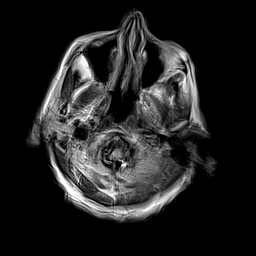
[im 13/25]
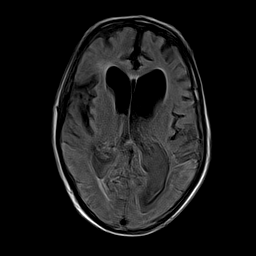
[im 25/25]
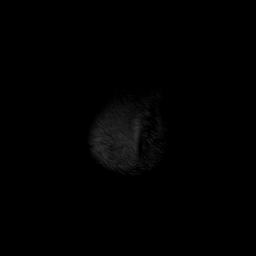

[Series 14: swi_images · axial · 3.0mm · 0.90mm/px · z∈[-79,+73]mm · 6 of 52 slices shown]
[im 1/52]
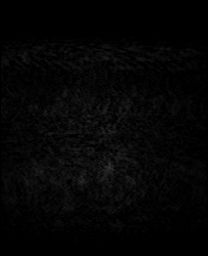
[im 11/52]
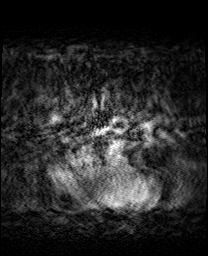
[im 21/52]
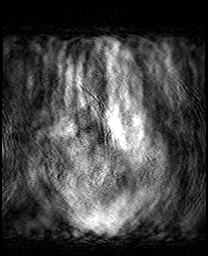
[im 31/52]
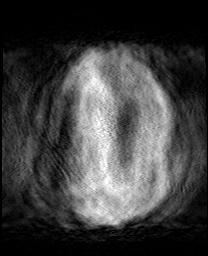
[im 41/52]
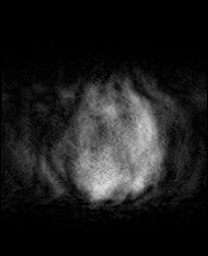
[im 52/52]
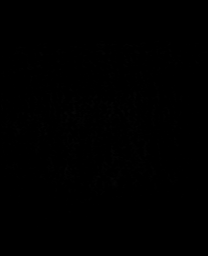

[Series 16: ax hemo · axial · 5.0mm · 0.86mm/px · z∈[-72,+71]mm · 3 of 25 slices shown]
[im 1/25]
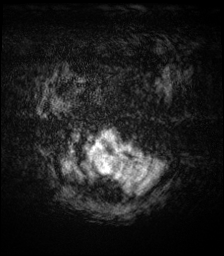
[im 13/25]
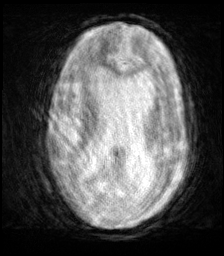
[im 25/25]
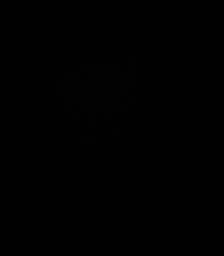

[48 of 48 positions shown; findings below may reference images not displayed]

FINDINGS: Limited study due to motion. Most acquired images are essentially
nondiagnostic.

Brain: No acute infarction, hemorrhage, extra-axial collection or
large mass lesion. Prominently dilated supratentorial ventricles
with associated bowing of the corpus callosum and crowding of the
parietal sulci may represent normal pressure hydrocephalus in the
appropriate clinical scenario.

Vascular: Evaluation precluded by motion.

Skull and upper cervical spine: Grossly unremarkable.

Sinuses/Orbits: Negative.

Other: None.
IMPRESSION: 1. Significantly limited study due to motion artifact.
2. No acute intracranial abnormality identified.
3. Prominently dilated supratentorial ventricles with associated
bowing of corpus callosum and crowding of parietal sulci may
represent normal pressure hydrocephalus in the appropriate clinical
scenario.
